# Patient Record
Sex: Male | Born: 1955
Health system: Southern US, Community
[De-identification: ages and names within clinical notes are randomized; demographics above are authoritative.]

## PROBLEM LIST (undated history)

## (undated) DIAGNOSIS — C801 Malignant (primary) neoplasm, unspecified: Secondary | ICD-10-CM

## (undated) DIAGNOSIS — J45909 Unspecified asthma, uncomplicated: Secondary | ICD-10-CM

## (undated) DIAGNOSIS — T7840XA Allergy, unspecified, initial encounter: Secondary | ICD-10-CM

## (undated) HISTORY — DX: Unspecified asthma, uncomplicated: J45.909

## (undated) HISTORY — DX: Malignant (primary) neoplasm, unspecified: C80.1

## (undated) HISTORY — DX: Allergy, unspecified, initial encounter: T78.40XA

## (undated) HISTORY — PX: HERNIA REPAIR: SHX51

---

## 1998-12-11 ENCOUNTER — Ambulatory Visit (HOSPITAL_COMMUNITY): Admission: RE | Admit: 1998-12-11 | Discharge: 1998-12-11 | Payer: Self-pay | Admitting: Surgery

## 2006-05-12 ENCOUNTER — Ambulatory Visit: Payer: Self-pay | Admitting: Internal Medicine

## 2006-05-26 ENCOUNTER — Ambulatory Visit: Payer: Self-pay | Admitting: Internal Medicine

## 2013-06-22 ENCOUNTER — Ambulatory Visit (INDEPENDENT_AMBULATORY_CARE_PROVIDER_SITE_OTHER): Payer: PRIVATE HEALTH INSURANCE | Admitting: Emergency Medicine

## 2013-06-22 VITALS — BP 118/76 | HR 75 | Temp 98.0°F | Resp 16 | Ht 71.0 in | Wt 159.0 lb

## 2013-06-22 DIAGNOSIS — Z760 Encounter for issue of repeat prescription: Secondary | ICD-10-CM

## 2013-06-22 DIAGNOSIS — K4090 Unilateral inguinal hernia, without obstruction or gangrene, not specified as recurrent: Secondary | ICD-10-CM

## 2013-06-22 MED ORDER — ALBUTEROL SULFATE HFA 108 (90 BASE) MCG/ACT IN AERS: 2.0000 | INHALATION_SPRAY | RESPIRATORY_TRACT | Status: DC | PRN

## 2013-06-22 MED ORDER — BUDESONIDE 180 MCG/ACT IN AEPB: 1.0000 | INHALATION_SPRAY | Freq: Two times a day (BID) | RESPIRATORY_TRACT | Status: DC

## 2013-06-22 NOTE — Progress Notes (Addendum)
   Subjective:    Patient ID: Shaun Lee, male    DOB: 04/13/1955, 58 y.o.   MRN: [id number]  HPI 58 yo male with complaint of needing his asthma medications refilled.  He is running out of albuterol and pulmicort.  Patient also complaints of hernia in right lower abdomen.  He denies pain, but has noticed buldge for several months.  No n/v/d/c.    PPMH:  Left inguinal hernia repair.    SH:  Nonsmoker, occasional alcohol   Review of Systems  Constitutional: Negative for fever and chills.  Gastrointestinal: Negative for nausea, vomiting, diarrhea and constipation.  Genitourinary: Negative for dysuria, urgency, penile swelling, penile pain and testicular pain.       Objective:   Physical Exam Blood pressure 118/76, pulse 75, temperature 98 F (36.7 C), temperature source Oral, resp. rate 16, height 5\' 11"  (1.803 m), weight 159 lb (72.122 kg), SpO2 99.00%. Body mass index is 22.19 kg/(m^2). Well-developed, well nourished male who is awake, alert and oriented, in NAD. HEENT: [place]/AT, PERRL, EOMI.  Sclera and conjunctiva are clear.  OP is clear. Neck: supple, non-tender, no lymphadenopathy, thyromegaly. Heart: RRR, no murmur Lungs: normal effort, CTA Abdomen: nontender right inguinal hernia, easily reducible. Extremities: no cyanosis, clubbing or edema. Skin: warm and dry without rash. Psychologic: good mood and appropriate affect, normal speech and behavior.      Assessment & Plan:  1.  Medication refill 2.  Hernia - easily reducible  Plan:  Patients medications refilled.  He had left inguinal hernia by Dr. Lucia Gaskins in 2000.  Does not wish to persue correction at this point; however, will consider it in winter.  If he elects to/needs referal to Dr. Lucia Gaskins he will call and this can be arranged.  I have reviewed and agree with documentation. Robert P. Laney Pastor, M.D.

## 2013-07-28 DIAGNOSIS — K4090 Unilateral inguinal hernia, without obstruction or gangrene, not specified as recurrent: Secondary | ICD-10-CM | POA: Insufficient documentation

## 2013-07-28 DIAGNOSIS — N4341 Spermatocele of epididymis, single: Secondary | ICD-10-CM | POA: Insufficient documentation

## 2015-09-07 ENCOUNTER — Encounter: Payer: Self-pay | Admitting: Family Medicine

## 2015-09-07 ENCOUNTER — Ambulatory Visit (INDEPENDENT_AMBULATORY_CARE_PROVIDER_SITE_OTHER): Payer: PRIVATE HEALTH INSURANCE | Admitting: Family Medicine

## 2015-09-07 VITALS — BP 116/70 | HR 78 | Temp 97.7°F | Resp 18 | Ht 71.0 in | Wt 157.7 lb

## 2015-09-07 DIAGNOSIS — Z131 Encounter for screening for diabetes mellitus: Secondary | ICD-10-CM

## 2015-09-07 DIAGNOSIS — Z0000 Encounter for general adult medical examination without abnormal findings: Secondary | ICD-10-CM | POA: Diagnosis not present

## 2015-09-07 DIAGNOSIS — M79672 Pain in left foot: Secondary | ICD-10-CM

## 2015-09-07 DIAGNOSIS — J4520 Mild intermittent asthma, uncomplicated: Secondary | ICD-10-CM | POA: Diagnosis not present

## 2015-09-07 DIAGNOSIS — Z23 Encounter for immunization: Secondary | ICD-10-CM

## 2015-09-07 DIAGNOSIS — J309 Allergic rhinitis, unspecified: Secondary | ICD-10-CM

## 2015-09-07 DIAGNOSIS — Z114 Encounter for screening for human immunodeficiency virus [HIV]: Secondary | ICD-10-CM | POA: Diagnosis not present

## 2015-09-07 DIAGNOSIS — Z85828 Personal history of other malignant neoplasm of skin: Secondary | ICD-10-CM | POA: Diagnosis not present

## 2015-09-07 DIAGNOSIS — Z13220 Encounter for screening for lipoid disorders: Secondary | ICD-10-CM | POA: Diagnosis not present

## 2015-09-07 DIAGNOSIS — M79671 Pain in right foot: Secondary | ICD-10-CM

## 2015-09-07 DIAGNOSIS — Z1159 Encounter for screening for other viral diseases: Secondary | ICD-10-CM

## 2015-09-07 DIAGNOSIS — Z125 Encounter for screening for malignant neoplasm of prostate: Secondary | ICD-10-CM

## 2015-09-07 LAB — PSA: PSA: 6.7 ng/mL — ABNORMAL HIGH (ref <=4.0)

## 2015-09-07 LAB — LIPID PANEL
Cholesterol: 190 mg/dL (ref 125–200)
HDL: 48 mg/dL (ref >=40)
LDL Cholesterol: 128 mg/dL (ref <130)
Total CHOL/HDL Ratio: 4 Ratio (ref <=5.0)
Triglycerides: 72 mg/dL (ref <150)
VLDL: 14 mg/dL (ref <30)

## 2015-09-07 LAB — COMPLETE METABOLIC PANEL WITH GFR
ALT: 23 U/L (ref 9–46)
AST: 24 U/L (ref 10–35)
Albumin: 4.9 g/dL (ref 3.6–5.1)
Alkaline Phosphatase: 48 U/L (ref 40–115)
BUN: 15 mg/dL (ref 7–25)
CO2: 28 mmol/L (ref 20–31)
Calcium: 9.7 mg/dL (ref 8.6–10.3)
Chloride: 101 mmol/L (ref 98–110)
Creat: 1.03 mg/dL (ref 0.70–1.25)
GFR, Est African American: >89 mL/min (ref >=60)
GFR, Est Non African American: 78 mL/min (ref >=60)
Glucose, Bld: 94 mg/dL (ref 65–99)
Potassium: 4.3 mmol/L (ref 3.5–5.3)
Sodium: 139 mmol/L (ref 135–146)
Total Bilirubin: 1.1 mg/dL (ref 0.2–1.2)
Total Protein: 7.4 g/dL (ref 6.1–8.1)

## 2015-09-07 LAB — HIV ANTIBODY (ROUTINE TESTING W REFLEX): HIV 1&2 Ab, 4th Generation: NONREACTIVE

## 2015-09-07 LAB — HEPATITIS C ANTIBODY: HCV Ab: NEGATIVE

## 2015-09-07 MED ORDER — ALBUTEROL SULFATE HFA 108 (90 BASE) MCG/ACT IN AERS
2.0000 | INHALATION_SPRAY | RESPIRATORY_TRACT | 1 refills | Status: DC | PRN
Start: 2015-09-07 — End: 2018-05-13

## 2015-09-07 MED ORDER — FLUTICASONE PROPIONATE 50 MCG/ACT NA SUSP: 1.0000 | Freq: Every day | NASAL | 1 refills | Status: DC

## 2015-09-07 MED ORDER — ZOSTER VACCINE LIVE 19400 UNT/0.65ML ~~LOC~~ SUSR: 0.6500 mL | Freq: Once | SUBCUTANEOUS | 0 refills | Status: AC

## 2015-09-07 MED ORDER — BUDESONIDE 180 MCG/ACT IN AEPB: 1.0000 | INHALATION_SPRAY | Freq: Two times a day (BID) | RESPIRATORY_TRACT | 5 refills | Status: DC

## 2015-09-07 NOTE — Patient Instructions (Addendum)
Continue same treatment for allergies and asthma, but if more than few times per week need of albuterol, or nighttime symptoms - return to discuss regimen further.  I would recommend following up with dermatologist for skin check with history of skin cancer. I will refer you.   Try silicone insert under heel or other insert to see if this helps foot pain. If not improving in the next 3-4 weeks, return for recheck.   Keeping you healthy  Get these tests  Blood pressure- Have your blood pressure checked once a year by your healthcare provider.  Normal blood pressure is 120/80  Weight- Have your body mass index (BMI) calculated to screen for obesity.  BMI is a measure of body fat based on height and weight. You can also calculate your own BMI at [email or web address].  Cholesterol- Have your cholesterol checked every year.  Diabetes- Have your blood sugar checked regularly if you have high blood pressure, high cholesterol, have a family history of diabetes or if you are overweight.  Screening for Colon Cancer- Colonoscopy starting at age 26.  Screening may begin sooner depending on your family history and other health conditions. Follow up colonoscopy as directed by your Gastroenterologist.  Screening for Prostate Cancer- Both blood work (PSA) and a rectal exam help screen for Prostate Cancer.  Screening begins at age 20 with African-American men and at age 34 with Caucasian men.  Screening may begin sooner depending on your family history.  Take these medicines  Aspirin- One aspirin daily can help prevent Heart disease and Stroke.  Flu shot- Every fall.  Tetanus- Every 10 years.  Zostavax- Once after the age of 24 to prevent Shingles.  Pneumonia shot- Once after the age of 24; if you are younger than 48, ask your healthcare provider if you need a Pneumonia shot.  Take these steps  Don't smoke- If you do smoke, talk to your doctor about quitting.  For tips on how to quit, go to  www.smokefree.gov or call 1-800-QUIT-NOW.  Be physically active- Exercise 5 days a week for at least 30 minutes.  If you are not already physically active start slow and gradually work up to 30 minutes of moderate physical activity.  Examples of moderate activity include walking briskly, mowing the yard, dancing, swimming, bicycling, etc.  Eat a healthy diet- Eat a variety of healthy food such as fruits, vegetables, low fat milk, low fat cheese, yogurt, lean meant, poultry, fish, beans, tofu, etc. For more information go to www.thenutritionsource.org  Drink alcohol in moderation- Limit alcohol intake to less than two drinks a day. Never drink and drive.  Dentist- Brush and floss twice daily; visit your dentist twice a year.  Depression- Your emotional health is as important as your physical health. If you're feeling down, or losing interest in things you would normally enjoy please talk to your healthcare provider.  Eye exam- Visit your eye doctor every year.  Safe sex- If you may be exposed to a sexually transmitted infection, use a condom.  Seat belts- Seat belts can save your life; always wear one.  Smoke/Carbon Monoxide detectors- These detectors need to be installed on the appropriate level of your home.  Replace batteries at least once a year.  Skin cancer- When out in the sun, cover up and use sunscreen 15 SPF or higher.  Violence- If anyone is threatening you, please tell your healthcare provider.  Living Will/ Health care power of attorney- Speak with your healthcare provider and family.  Asthma,  Adult Asthma is a recurring condition in which the airways tighten and narrow. Asthma can make it difficult to breathe. It can cause coughing, wheezing, and shortness of breath. Asthma episodes, also called asthma attacks, range from minor to life-threatening. Asthma cannot be cured, but medicines and lifestyle changes can help control it. CAUSES Asthma is believed to be caused by  inherited (genetic) and environmental factors, but its exact cause is unknown. Asthma may be triggered by allergens, lung infections, or irritants in the air. Asthma triggers are different for each person. Common triggers include:   Animal dander.  Dust mites.  Cockroaches.  Pollen from trees or grass.  Mold.  Smoke.  Air pollutants such as dust, household cleaners, hair sprays, aerosol sprays, paint fumes, strong chemicals, or strong odors.  Cold air, weather changes, and winds (which increase molds and pollens in the air).  Strong emotional expressions such as crying or laughing hard.  Stress.  Certain medicines (such as aspirin) or types of drugs (such as beta-blockers).  Sulfites in foods and drinks. Foods and drinks that may contain sulfites include dried fruit, potato chips, and sparkling grape juice.  Infections or inflammatory conditions such as the flu, a cold, or an inflammation of the nasal membranes (rhinitis).  Gastroesophageal reflux disease (GERD).  Exercise or strenuous activity. SYMPTOMS Symptoms may occur immediately after asthma is triggered or many hours later. Symptoms include:  Wheezing.  Excessive nighttime or early morning coughing.  Frequent or severe coughing with a common cold.  Chest tightness.  Shortness of breath. DIAGNOSIS  The diagnosis of asthma is made by a review of your medical history and a physical exam. Tests may also be performed. These may include:  Lung function studies. These tests show how much air you breathe in and out.  Allergy tests.  Imaging tests such as X-rays. TREATMENT  Asthma cannot be cured, but it can usually be controlled. Treatment involves identifying and avoiding your asthma triggers. It also involves medicines. There are 2 classes of medicine used for asthma treatment:   Controller medicines. These prevent asthma symptoms from occurring. They are usually taken every day.  Reliever or rescue medicines.  These quickly relieve asthma symptoms. They are used as needed and provide short-term relief. Your health care provider will help you create an asthma action plan. An asthma action plan is a written plan for managing and treating your asthma attacks. It includes a list of your asthma triggers and how they may be avoided. It also includes information on when medicines should be taken and when their dosage should be changed. An action plan may also involve the use of a device called a peak flow meter. A peak flow meter measures how well the lungs are working. It helps you monitor your condition. HOME CARE INSTRUCTIONS   Take medicines only as directed by your health care provider. Speak with your health care provider if you have questions about how or when to take the medicines.  Use a peak flow meter as directed by your health care provider. Record and keep track of readings.  Understand and use the action plan to help minimize or stop an asthma attack without needing to seek medical care.  Control your home environment in the following ways to help prevent asthma attacks:  Do not smoke. Avoid being exposed to secondhand smoke.  Change your heating and air conditioning filter regularly.  Limit your use of fireplaces and wood stoves.  Get rid of pests (such as roaches  and mice) and their droppings.  Throw away plants if you see mold on them.  Clean your floors and dust regularly. Use unscented cleaning products.  Try to have someone else vacuum for you regularly. Stay out of rooms while they are being vacuumed and for a short while afterward. If you vacuum, use a dust mask from a hardware store, a double-layered or microfilter vacuum cleaner bag, or a vacuum cleaner with a HEPA filter.  Replace carpet with wood, tile, or vinyl flooring. Carpet can trap dander and dust.  Use allergy-proof pillows, mattress covers, and box spring covers.  Wash bed sheets and blankets every week in hot water  and dry them in a dryer.  Use blankets that are made of polyester or cotton.  Clean bathrooms and kitchens with bleach. If possible, have someone repaint the walls in these rooms with mold-resistant paint. Keep out of the rooms that are being cleaned and painted.  Wash hands frequently. SEEK MEDICAL CARE IF:   You have wheezing, shortness of breath, or a cough even if taking medicine to prevent attacks.  The colored mucus you cough up (sputum) is thicker than usual.  Your sputum changes from clear or white to yellow, green, gray, or bloody.  You have any problems that may be related to the medicines you are taking (such as a rash, itching, swelling, or trouble breathing).  You are using a reliever medicine more than 2-3 times per week.  Your peak flow is still at 50-79% of your personal best after following your action plan for 1 hour.  You have a fever. SEEK IMMEDIATE MEDICAL CARE IF:   You seem to be getting worse and are unresponsive to treatment during an asthma attack.  You are short of breath even at rest.  You get short of breath when doing very little physical activity.  You have difficulty eating, drinking, or talking due to asthma symptoms.  You develop chest pain.  You develop a fast heartbeat.  You have a bluish color to your lips or fingernails.  You are light-headed, dizzy, or faint.  Your peak flow is less than 50% of your personal best.   This information is not intended to replace advice given to you by your health care provider. Make sure you discuss any questions you have with your health care provider.   Document Released: 01/14/2005 Document Revised: 10/05/2014 Document Reviewed: 08/13/2012 Elsevier Interactive Patient Education 2016 [employer].   IF you received an x-ray today, you will receive an invoice from [facility]. Please contact [facility] at [phone] with questions or concerns regarding your invoice.   IF you  received labwork today, you will receive an invoice from [employer]. Please contact [facility] at [phone] with questions or concerns regarding your invoice.   Our billing staff will not be able to assist you with questions regarding bills from these companies.  You will be contacted with the lab results as soon as they are available. The fastest way to get your results is to activate your My Chart account. Instructions are located on the last page of this paperwork. If you have not heard from [place] regarding the results in 2 weeks, please contact this office.

## 2015-09-07 NOTE — Progress Notes (Signed)
By signing my name below, I, Shaun Lee, attest that this documentation has been prepared under the direction and in the presence of Shaun Ray, MD.  Electronically Signed: Verlee Lee, Medical Scribe. 09/07/15. 10:00 AM.  Subjective:    Patient ID: Shaun Lee, male    DOB: December 07, 1955, 60 y.o.   MRN: [id number]  HPI Chief Complaint  Patient presents with  . Annual Exam    CPE    HPI Comments: Shaun Lee is a 60 y.o. male with a PMHx of asthma, and allergic rhinitis who presents to the [facility] for his annual physical. Pt is also complaining of heel pain. Pt works 60 hour work day and has been working for his job for 40 years.  Pt has been fasting this morning.  Heel Pain: Pt has been experiecing outside heel pain for 6 months the first time he get up when he's been sitting for a while, or when he puts pressure on his feet at night that's not as painful during the day. Pt mentions he got new work boots earlier this year and suspects it's causing his feel pain. Pt denies getting an injection for plantar fasciitis. Pt hasn't been taking any medications for his symptoms. Pt denies rash, bumps, or sleep disturbance from the pain.  FHx: Dad had a micro valve problem at 106-70. Pt' denies CP, SOB, chest tightness.  Cancer Screening: Prostate Cancer Screening: Last prostate test was about 10 years ago. Pt denies FHx of prostate CA, but his dad had some benign prostate problems. No results found for: PSA  Colon Cancer Screening: Last done in 2008 and was nl. Plan on repeat next year. Skin Cancer Screening: Pt had squamous cell carcinoma on his right upper back 15-20 years ago, which is the last time he saw his dermatologist. Pt reports a small itchy area on his arm.  Asthma: Takes Albuterol every couple of months, and Pulmicort 180 mcg BID. Pt mentions his Pulmicort inhaler last for month, and he has to use Albuterol 2-3 times a week during the spring. Pt  denies experiencing side effects while on his asthma medication- no oral fungal infections. Pt is not a smoker.   Allergic Rhinitis: Pt uses 1 spray of Flonase in each nostril, and takes Allegra for his allergies in the spring.  Immunizations: Pt doesn't remember his last tetanus shot, but it's been over 10 years. Pt's last flu vaccine was done Nov 2016 through his job (he gets it every year through them). Pt hasn't had the shingles vaccine.  There is no immunization history on file for this patient.  Exercise: Pt has been walking for exercise 4-5 times a week for 30 mins for a year now.  Dentist: Pt goes every 6 months.  Vision: Pt has prescription glasses at work, but he normally doesn't wear glasses.  Visual Acuity Screening   Right eye Left eye Both eyes  Without correction: 20/20 20/20 20/15   With correction:      Depression: Depression screen [facility] 2/9 09/07/2015  Decreased Interest 0  Down, Depressed, Hopeless 0  PHQ - 2 Score 0   HIV/Hep C Screening: Pt has not had his Hep C or HIV screening, but would like to get it today.  Functional Status:  Fall Screening:  Patient Active Problem List   Diagnosis Date Noted  . Inguinal hernia, right 07/28/2013  . Spermatocele of epididymis, single 07/28/2013   Past Medical History:  Diagnosis Date  . Allergy   .  Asthma   . Cancer [facility])    skin cancer   Past Surgical History:  Procedure Laterality Date  . HERNIA REPAIR     No Known Allergies Prior to Admission medications   Medication Sig Start Date End Date Taking? Authorizing Provider  albuterol (PROVENTIL HFA;VENTOLIN HFA) 108 (90 BASE) MCG/ACT inhaler Inhale 2 puffs into the lungs every 4 (four) hours as needed for wheezing or shortness of breath (cough, shortness of breath or wheezing.). 06/22/13   Hennie Duos, MD  albuterol (PROVENTIL, VENTOLIN) (5 MG/ML) 0.5% NEBU Take by nebulization continuous.    Historical Provider, MD  budesonide (PULMICORT FLEXHALER) 180  MCG/ACT inhaler Inhale 1 puff into the lungs 2 (two) times daily. 06/22/13   Hennie Duos, MD  Budesonide (PULMICORT IN) Inhale into the lungs.    Historical Provider, MD  Fexofenadine HCl (ALLEGRA PO) Take by mouth.    Historical Provider, MD  fluticasone (FLONASE) 50 MCG/ACT nasal spray Place into both nostrils daily.    Historical Provider, MD   Social History   Social History  . Marital status: Married    Spouse name: N/A  . Number of children: N/A  . Years of education: N/A   Occupational History  . Not on file.   Social History Main Topics  . Smoking status: Never Smoker  . Smokeless tobacco: Never Used  . Alcohol use Yes  . Drug use: No  . Sexual activity: Not on file   Other Topics Concern  . Not on file   Social History Narrative  . No narrative on file   Review of Systems  13 point ROS positive for myalgias, otherwise negative.  Objective:  Physical Exam  Constitutional: He is oriented to person, place, and time. He appears well-developed and well-nourished.  HENT:  Head: Normocephalic and atraumatic.  Right Ear: External ear normal.  Left Ear: External ear normal.  Mouth/Throat: Oropharynx is clear and moist.  Eyes: Conjunctivae and EOM are normal. Pupils are equal, round, and reactive to light.  Neck: Normal range of motion. Neck supple. No thyromegaly present.  Cardiovascular: Normal rate, regular rhythm, normal heart sounds and intact distal pulses.   Pulmonary/Chest: Effort normal and breath sounds normal. No respiratory distress. He has no wheezes.  Abdominal: Soft. He exhibits no distension. There is no tenderness. Hernia confirmed negative in the right inguinal area and confirmed negative in the left inguinal area.  Genitourinary: Prostate normal.  Musculoskeletal: Normal range of motion. He exhibits no edema or tenderness.  Neg lateral squeeze of calcaneus Plantar fasciitis non tender, no rash  Lymphadenopathy:    He has no cervical adenopathy.    Neurological: He is alert and oriented to person, place, and time. He has normal reflexes.  Skin: Skin is warm and dry.  Psychiatric: He has a normal mood and affect. His behavior is normal.  Vitals reviewed.  BP 116/70   Pulse 78   Temp 97.7 F (36.5 C) (Oral)   Resp 18   Ht 5\' 11"  (1.803 m)   Wt 157 lb 11.2 oz (71.5 kg)   SpO2 99%   BMI 21.99 kg/m  Assessment & Plan:    TANK SAYANI is a 60 y.o. male Annual physical exam  --anticipatory guidance as below in AVS, screening labs above. Health maintenance items as above in HPI discussed/recommended as applicable.   History of squamous cell carcinoma of skin - Plan: Ambulatory referral to Dermatology  -derm eval for routine eval/skin check.   Asthma,  mild intermittent, uncomplicated - Plan: budesonide (PULMICORT FLEXHALER) 180 MCG/ACT inhaler, albuterol (PROVENTIL HFA;VENTOLIN HFA) 108 (90 Base) MCG/ACT inhaler  - Able. Continue same medications, consider adding long-acting bronchodilator if increasing symptoms during allergy season.  Allergic rhinitis, unspecified allergic rhinitis type - Plan: fluticasone (FLONASE) 50 MCG/ACT nasal spray  -Stable. Continue Flonase. Antihistamine as needed.  Screening for prostate cancer - Plan: PSA  -We discussed pros and cons of prostate cancer screening, and after this discussion, he chose to have screening done. PSA obtained, and no concerning findings on DRE.   Need for hepatitis C screening test - Plan: Hepatitis C antibody  Screening for HIV (human immunodeficiency virus) - Plan: HIV antibody  Screening for diabetes mellitus - Plan: COMPLETE METABOLIC PANEL WITH GFR  Screening for hyperlipidemia - Plan: Lipid panel  Need for Tdap vaccination - Plan: Tdap vaccine greater than or equal to 7yo IM  Need for shingles vaccine - Plan: Zoster Vaccine Live, PF, (ZOSTAVAX) 16109 UNT/0.65ML injection printed for him to check in to cost.  Heel pain, bilateral  -Trial of over-the-counter  heel insert/silicone gel. If not improving, return for recheck.   Meds ordered this encounter  Medications  . budesonide (PULMICORT FLEXHALER) 180 MCG/ACT inhaler    Sig: Inhale 1 puff into the lungs 2 (two) times daily.    Dispense:  1 Inhaler    Refill:  5  . albuterol (PROVENTIL HFA;VENTOLIN HFA) 108 (90 Base) MCG/ACT inhaler    Sig: Inhale 2 puffs into the lungs every 4 (four) hours as needed for wheezing or shortness of breath (cough, shortness of breath or wheezing.).    Dispense:  1 Inhaler    Refill:  1  . fluticasone (FLONASE) 50 MCG/ACT nasal spray    Sig: Place 1-2 sprays into both nostrils daily.    Dispense:  16 g    Refill:  1  . Zoster Vaccine Live, PF, (ZOSTAVAX) 60454 UNT/0.65ML injection    Sig: Inject 19,400 Units into the skin once.    Dispense:  1 each    Refill:  0   Patient Instructions   Continue same treatment for allergies and asthma, but if more than few times per week need of albuterol, or nighttime symptoms - return to discuss regimen further.  I would recommend following up with dermatologist for skin check with history of skin cancer. I will refer you.   Try silicone insert under heel or other insert to see if this helps foot pain. If not improving in the next 3-4 weeks, return for recheck.   Keeping you healthy  Get these tests  Blood pressure- Have your blood pressure checked once a year by your healthcare provider.  Normal blood pressure is 120/80  Weight- Have your body mass index (BMI) calculated to screen for obesity.  BMI is a measure of body fat based on height and weight. You can also calculate your own BMI at ViewBanking.si.  Cholesterol- Have your cholesterol checked every year.  Diabetes- Have your blood sugar checked regularly if you have high blood pressure, high cholesterol, have a family history of diabetes or if you are overweight.  Screening for Colon Cancer- Colonoscopy starting at age 88.  Screening may begin  sooner depending on your family history and other health conditions. Follow up colonoscopy as directed by your Gastroenterologist.  Screening for Prostate Cancer- Both blood work (PSA) and a rectal exam help screen for Prostate Cancer.  Screening begins at age 82 with African-American men and at age  93 with Caucasian men.  Screening may begin sooner depending on your family history.  Take these medicines  Aspirin- One aspirin daily can help prevent Heart disease and Stroke.  Flu shot- Every fall.  Tetanus- Every 10 years.  Zostavax- Once after the age of 66 to prevent Shingles.  Pneumonia shot- Once after the age of 88; if you are younger than 2, ask your healthcare provider if you need a Pneumonia shot.  Take these steps  Don't smoke- If you do smoke, talk to your doctor about quitting.  For tips on how to quit, go to www.smokefree.gov or call 1-800-QUIT-NOW.  Be physically active- Exercise 5 days a week for at least 30 minutes.  If you are not already physically active start slow and gradually work up to 30 minutes of moderate physical activity.  Examples of moderate activity include walking briskly, mowing the yard, dancing, swimming, bicycling, etc.  Eat a healthy diet- Eat a variety of healthy food such as fruits, vegetables, low fat milk, low fat cheese, yogurt, lean meant, poultry, fish, beans, tofu, etc. For more information go to www.thenutritionsource.org  Drink alcohol in moderation- Limit alcohol intake to less than two drinks a day. Never drink and drive.  Dentist- Brush and floss twice daily; visit your dentist twice a year.  Depression- Your emotional health is as important as your physical health. If you're feeling down, or losing interest in things you would normally enjoy please talk to your healthcare provider.  Eye exam- Visit your eye doctor every year.  Safe sex- If you may be exposed to a sexually transmitted infection, use a condom.  Seat belts- Seat belts  can save your life; always wear one.  Smoke/Carbon Monoxide detectors- These detectors need to be installed on the appropriate level of your home.  Replace batteries at least once a year.  Skin cancer- When out in the sun, cover up and use sunscreen 15 SPF or higher.  Violence- If anyone is threatening you, please tell your healthcare provider.  Living Will/ Health care power of attorney- Speak with your healthcare provider and family.  Asthma, Adult Asthma is a recurring condition in which the airways tighten and narrow. Asthma can make it difficult to breathe. It can cause coughing, wheezing, and shortness of breath. Asthma episodes, also called asthma attacks, range from minor to life-threatening. Asthma cannot be cured, but medicines and lifestyle changes can help control it. CAUSES Asthma is believed to be caused by inherited (genetic) and environmental factors, but its exact cause is unknown. Asthma may be triggered by allergens, lung infections, or irritants in the air. Asthma triggers are different for each person. Common triggers include:   Animal dander.  Dust mites.  Cockroaches.  Pollen from trees or grass.  Mold.  Smoke.  Air pollutants such as dust, household cleaners, hair sprays, aerosol sprays, paint fumes, strong chemicals, or strong odors.  Cold air, weather changes, and winds (which increase molds and pollens in the air).  Strong emotional expressions such as crying or laughing hard.  Stress.  Certain medicines (such as aspirin) or types of drugs (such as beta-blockers).  Sulfites in foods and drinks. Foods and drinks that may contain sulfites include dried fruit, potato chips, and sparkling grape juice.  Infections or inflammatory conditions such as the flu, a cold, or an inflammation of the nasal membranes (rhinitis).  Gastroesophageal reflux disease (GERD).  Exercise or strenuous activity. SYMPTOMS Symptoms may occur immediately after asthma is  triggered or many hours  later. Symptoms include:  Wheezing.  Excessive nighttime or early morning coughing.  Frequent or severe coughing with a common cold.  Chest tightness.  Shortness of breath. DIAGNOSIS  The diagnosis of asthma is made by a review of your medical history and a physical exam. Tests may also be performed. These may include:  Lung function studies. These tests show how much air you breathe in and out.  Allergy tests.  Imaging tests such as X-rays. TREATMENT  Asthma cannot be cured, but it can usually be controlled. Treatment involves identifying and avoiding your asthma triggers. It also involves medicines. There are 2 classes of medicine used for asthma treatment:   Controller medicines. These prevent asthma symptoms from occurring. They are usually taken every day.  Reliever or rescue medicines. These quickly relieve asthma symptoms. They are used as needed and provide short-term relief. Your health care provider will help you create an asthma action plan. An asthma action plan is a written plan for managing and treating your asthma attacks. It includes a list of your asthma triggers and how they may be avoided. It also includes information on when medicines should be taken and when their dosage should be changed. An action plan may also involve the use of a device called a peak flow meter. A peak flow meter measures how well the lungs are working. It helps you monitor your condition. HOME CARE INSTRUCTIONS   Take medicines only as directed by your health care provider. Speak with your health care provider if you have questions about how or when to take the medicines.  Use a peak flow meter as directed by your health care provider. Record and keep track of readings.  Understand and use the action plan to help minimize or stop an asthma attack without needing to seek medical care.  Control your home environment in the following ways to help prevent asthma  attacks:  Do not smoke. Avoid being exposed to secondhand smoke.  Change your heating and air conditioning filter regularly.  Limit your use of fireplaces and wood stoves.  Get rid of pests (such as roaches and mice) and their droppings.  Throw away plants if you see mold on them.  Clean your floors and dust regularly. Use unscented cleaning products.  Try to have someone else vacuum for you regularly. Stay out of rooms while they are being vacuumed and for a short while afterward. If you vacuum, use a dust mask from a hardware store, a double-layered or microfilter vacuum cleaner bag, or a vacuum cleaner with a HEPA filter.  Replace carpet with wood, tile, or vinyl flooring. Carpet can trap dander and dust.  Use allergy-proof pillows, mattress covers, and box spring covers.  Wash bed sheets and blankets every week in hot water and dry them in a dryer.  Use blankets that are made of polyester or cotton.  Clean bathrooms and kitchens with bleach. If possible, have someone repaint the walls in these rooms with mold-resistant paint. Keep out of the rooms that are being cleaned and painted.  Wash hands frequently. SEEK MEDICAL CARE IF:   You have wheezing, shortness of breath, or a cough even if taking medicine to prevent attacks.  The colored mucus you cough up (sputum) is thicker than usual.  Your sputum changes from clear or white to yellow, green, gray, or bloody.  You have any problems that may be related to the medicines you are taking (such as a rash, itching, swelling, or trouble breathing).  You are using a reliever medicine more than 2-3 times per week.  Your peak flow is still at 50-79% of your personal best after following your action plan for 1 hour.  You have a fever. SEEK IMMEDIATE MEDICAL CARE IF:   You seem to be getting worse and are unresponsive to treatment during an asthma attack.  You are short of breath even at rest.  You get short of breath when  doing very little physical activity.  You have difficulty eating, drinking, or talking due to asthma symptoms.  You develop chest pain.  You develop a fast heartbeat.  You have a bluish color to your lips or fingernails.  You are light-headed, dizzy, or faint.  Your peak flow is less than 50% of your personal best.   This information is not intended to replace advice given to you by your health care provider. Make sure you discuss any questions you have with your health care provider.   Document Released: 01/14/2005 Document Revised: 10/05/2014 Document Reviewed: 08/13/2012 Elsevier Interactive Patient Education 2016 [employer].   IF you received an x-Lee today, you will receive an invoice from [facility]. Please contact [facility] at [phone] with questions or concerns regarding your invoice.   IF you received labwork today, you will receive an invoice from [employer]. Please contact [facility] at [phone] with questions or concerns regarding your invoice.   Our billing staff will not be able to assist you with questions regarding bills from these companies.  You will be contacted with the lab results as soon as they are available. The fastest way to get your results is to activate your My Chart account. Instructions are located on the last page of this paperwork. If you have not heard from [place] regarding the results in 2 weeks, please contact this office.        I personally performed the services described in this documentation, which was scribed in my presence. The recorded information has been reviewed and considered, and addended by me as needed.   Signed,   Shaun Ray, MD [facility].  09/08/15 10:15 PM

## 2015-09-10 ENCOUNTER — Other Ambulatory Visit: Payer: Self-pay | Admitting: Family Medicine

## 2015-09-10 DIAGNOSIS — R972 Elevated prostate specific antigen [PSA]: Secondary | ICD-10-CM

## 2015-09-14 ENCOUNTER — Telehealth: Payer: Self-pay

## 2015-09-14 ENCOUNTER — Encounter: Payer: Self-pay | Admitting: Radiology

## 2015-09-14 NOTE — Telephone Encounter (Signed)
PT states the office we reffered him to does not accept his insurance. Pt would like to be referred to "Shaun Lee" in [place], this is a [facility] healthcare urology center based on what the pt stated. I printed lab results and mailed to pts address.

## 2015-09-14 NOTE — Telephone Encounter (Signed)
Patient request a copy of his lab result to be mailed. He had his physical done on 09/07/2015. [phone].

## 2016-06-18 ENCOUNTER — Encounter: Payer: Self-pay | Admitting: Internal Medicine

## 2016-07-10 NOTE — Telephone Encounter (Signed)
error 

## 2018-04-24 ENCOUNTER — Other Ambulatory Visit: Payer: Self-pay

## 2018-04-24 ENCOUNTER — Other Ambulatory Visit: Payer: Self-pay | Admitting: Family Medicine

## 2018-04-24 DIAGNOSIS — J4520 Mild intermittent asthma, uncomplicated: Secondary | ICD-10-CM

## 2018-05-13 ENCOUNTER — Telehealth (INDEPENDENT_AMBULATORY_CARE_PROVIDER_SITE_OTHER): Payer: BLUE CROSS/BLUE SHIELD | Admitting: Family Medicine

## 2018-05-13 ENCOUNTER — Other Ambulatory Visit: Payer: Self-pay

## 2018-05-13 ENCOUNTER — Encounter: Payer: Self-pay | Admitting: Family Medicine

## 2018-05-13 DIAGNOSIS — J45909 Unspecified asthma, uncomplicated: Secondary | ICD-10-CM | POA: Diagnosis not present

## 2018-05-13 DIAGNOSIS — J4530 Mild persistent asthma, uncomplicated: Secondary | ICD-10-CM

## 2018-05-13 DIAGNOSIS — J4520 Mild intermittent asthma, uncomplicated: Secondary | ICD-10-CM

## 2018-05-13 DIAGNOSIS — J309 Allergic rhinitis, unspecified: Secondary | ICD-10-CM | POA: Insufficient documentation

## 2018-05-13 MED ORDER — ALBUTEROL SULFATE HFA 108 (90 BASE) MCG/ACT IN AERS: 2.0000 | INHALATION_SPRAY | RESPIRATORY_TRACT | 1 refills | Status: DC | PRN

## 2018-05-13 MED ORDER — BUDESONIDE 180 MCG/ACT IN AEPB
2.0000 | INHALATION_SPRAY | Freq: Two times a day (BID) | RESPIRATORY_TRACT | 5 refills | Status: DC
Start: 2018-05-13 — End: 2019-03-30

## 2018-05-13 NOTE — Progress Notes (Signed)
Virtual Visit via Telephone Note  I connected with Shaun Lee on 05/13/18 at 9:09 AM by telephone and verified that I am speaking with the correct person using two identifiers.   I discussed the limitations, risks, security and privacy concerns of performing an evaluation and management service by telephone and the availability of in person appointments. I also discussed with the patient that there may be a patient responsible charge related to this service. The patient expressed understanding and agreed to proceed, consent obtained  Chief complaint: Asthma, allergies  History of Present Illness:  Asthma: On pulmicort. Sometimes has not needed during the winter. Needed during allergy season. Uses 1 puff twice per day. Uses albuterol up to daily during allergy season- up to 2 times per day, usually once per day.   Allergic rhinitis: otc allegra and flonase. Primarily on allegra - flonase seems to cause hoarseness.    Patient Active Problem List   Diagnosis Date Noted  . Asthma 05/13/2018  . Allergic rhinitis 05/13/2018  . Inguinal hernia, right 07/28/2013  . Spermatocele of epididymis, single 07/28/2013   Past Medical History:  Diagnosis Date  . Allergy   . Asthma   . Cancer [facility])    skin cancer   Past Surgical History:  Procedure Laterality Date  . HERNIA REPAIR     No Known Allergies Prior to Admission medications   Medication Sig Start Date End Date Taking? Authorizing Provider  albuterol (PROVENTIL HFA;VENTOLIN HFA) 108 (90 Base) MCG/ACT inhaler Inhale 2 puffs into the lungs every 4 (four) hours as needed for wheezing or shortness of breath (cough, shortness of breath or wheezing.). 09/07/15  Yes Wendie Agreste, MD  budesonide (PULMICORT FLEXHALER) 180 MCG/ACT inhaler Inhale 1 puff into the lungs 2 (two) times daily. 09/07/15  Yes Wendie Agreste, MD  Fexofenadine HCl (ALLEGRA PO) Take by mouth.   Yes [provider]  fluticasone (FLONASE) 50 MCG/ACT  nasal spray Place 1-2 sprays into both nostrils daily. 09/07/15  Yes Wendie Agreste, MD   Social History   Socioeconomic History  . Marital status: Married    Spouse name: Not on file  . Number of children: Not on file  . Years of education: Not on file  . Highest education level: Not on file  Occupational History  . Not on file  Social Needs  . Financial resource strain: Not on file  . Food insecurity:    Worry: Not on file    Inability: Not on file  . Transportation needs:    Medical: Not on file    Non-medical: Not on file  Tobacco Use  . Smoking status: Never Smoker  . Smokeless tobacco: Never Used  Substance and Sexual Activity  . Alcohol use: Yes  . Drug use: No  . Sexual activity: Not on file  Lifestyle  . Physical activity:    Days per week: Not on file    Minutes per session: Not on file  . Stress: Not on file  Relationships  . Social connections:    Talks on phone: Not on file    Gets together: Not on file    Attends religious service: Not on file    Active member of club or organization: Not on file    Attends meetings of clubs or organizations: Not on file    Relationship status: Not on file  . Intimate partner violence:    Fear of current or ex partner: Not on file  Emotionally abused: Not on file    Physically abused: Not on file    Forced sexual activity: Not on file  Other Topics Concern  . Not on file  Social History Narrative  . Not on file     Observations/Objective:   Assessment and Plan: Uncomplicated asthma, unspecified asthma severity, unspecified whether persistent  Allergic rhinitis, unspecified seasonality, unspecified trigger  Although seasonal use of asthma medication, has decreased control recently with daily need for albuterol.  Increase Pulmicort to 2 puffs twice per day, update in the next few weeks to decide if change to Symbicort needed for LABA addition.  Continue albuterol as needed for now.  Allergies overall  controlled with Allegra, but correct use of Flonase discussed to minimize throat irritation if needed.  Understanding expressed.  Follow Up Instructions: 2 weeks by mychart or telemed.    I discussed the assessment and treatment plan with the patient. The patient was provided an opportunity to ask questions and all were answered. The patient agreed with the plan and demonstrated an understanding of the instructions.   The patient was advised to call back or seek an in-person evaluation if the symptoms worsen or if the condition fails to improve as anticipated.  I provided 9 minutes of non-face-to-face time during this encounter.  Signed,   [name], MD [facility] [place].  05/13/18

## 2018-05-13 NOTE — Patient Instructions (Signed)
Try 2 puffs of pulmicort twice daily, okay to use albuterol if needed, but goal of not having to use that more than once or at the most twice per week.  Follow up with me in next 2 weeks to decide if further changes needed, including possibly changing Pulmicort to Symbicort.  Continue Allegra for allergies, Flonase if needed using the technique we discussed.  Let me know if there are questions.  Good talking to you today, take care.    Asthma, Adult  Asthma is a long-term (chronic) condition that causes recurrent episodes in which the airways become tight and narrow. The airways are the passages that lead from the nose and mouth down into the lungs. Asthma episodes, also called asthma attacks, can cause coughing, wheezing, shortness of breath, and chest pain. The airways can also fill with mucus. During an attack, it can be difficult to breathe. Asthma attacks can range from minor to life threatening. Asthma cannot be cured, but medicines and lifestyle changes can help control it and treat acute attacks. What are the causes? This condition is believed to be caused by inherited (genetic) and environmental factors, but its exact cause is not known. There are many things that can bring on an asthma attack or make asthma symptoms worse (triggers). Asthma triggers are different for each person. Common triggers include:  Mold.  Dust.  Cigarette smoke.  Cockroaches.  Things that can cause allergy symptoms (allergens), such as animal dander or pollen from trees or grass.  Air pollutants such as household cleaners, wood smoke, smog, or Advertising account planner.  Cold air, weather changes, and winds (which increase molds and pollen in the air).  Strong emotional expressions such as crying or laughing hard.  Stress.  Certain medicines (such as aspirin) or types of medicines (such as beta-blockers).  Sulfites in foods and drinks. Foods and drinks that may contain sulfites include dried fruit, potato  chips, and sparkling grape juice.  Infections or inflammatory conditions such as the flu, a cold, or inflammation of the nasal membranes (rhinitis).  Gastroesophageal reflux disease (GERD).  Exercise or strenuous activity. What are the signs or symptoms? Symptoms of this condition may occur right after asthma is triggered or many hours later. Symptoms include:  Wheezing. This can sound like whistling when you breathe.  Excessive nighttime or early morning coughing.  Frequent or severe coughing with a common cold.  Chest tightness.  Shortness of breath.  Tiredness (fatigue) with minimal activity. How is this diagnosed? This condition is diagnosed based on:  Your medical history.  A physical exam.  Tests, which may include: ? Lung function studies and pulmonary studies (spirometry). These tests can evaluate the flow of air in your lungs. ? Allergy tests. ? Imaging tests, such as X-rays. How is this treated? There is no cure for this condition, but treatment can help control your symptoms. Treatment for asthma usually involves:  Identifying and avoiding your asthma triggers.  Using medicines to control your symptoms. Generally, two types of medicines are used to treat asthma: ? Controller medicines. These help prevent asthma symptoms from occurring. They are usually taken every day. ? Fast-acting reliever or rescue medicines. These quickly relieve asthma symptoms by widening the narrow and tight airways. They are used as needed and provide short-term relief.  Using supplemental oxygen. This may be needed during a severe episode.  Using other medicines, such as: ? Allergy medicines, such as antihistamines, if your asthma attacks are triggered by allergens. ? Immune medicines (immunomodulators).  These are medicines that help control the immune system.  Creating an asthma action plan. An asthma action plan is a written plan for managing and treating your asthma attacks. This  plan includes: ? A list of your asthma triggers and how to avoid them. ? Information about when medicines should be taken and when their dosage should be changed. ? Instructions about using a device called a peak flow meter. A peak flow meter measures how well the lungs are working and the severity of your asthma. It helps you monitor your condition. Follow these instructions at home: Controlling your home environment Control your home environment in the following ways to help avoid triggers and prevent asthma attacks:  Change your heating and air conditioning filter regularly.  Limit your use of fireplaces and wood stoves.  Get rid of pests (such as roaches and mice) and their droppings.  Throw away plants if you see mold on them.  Clean floors and dust surfaces regularly. Use unscented cleaning products.  Try to have someone else vacuum for you regularly. Stay out of rooms while they are being vacuumed and for a short while afterward. If you vacuum, use a dust mask from a hardware store, a double-layered or microfilter vacuum cleaner bag, or a vacuum cleaner with a HEPA filter.  Replace carpet with wood, tile, or vinyl flooring. Carpet can trap dander and dust.  Use allergy-proof pillows, mattress covers, and box spring covers.  Keep your bedroom a trigger-free room.  Avoid pets and keep windows closed when allergens are in the air.  Wash beddings every week in hot water and dry them in a dryer.  Use blankets that are made of polyester or cotton.  Clean bathrooms and kitchens with bleach. If possible, have someone repaint the walls in these rooms with mold-resistant paint. Stay out of the rooms that are being cleaned and painted.  Wash your hands often with soap and water. If soap and water are not available, use hand sanitizer.  Do not allow anyone to smoke in your home. General instructions  Take over-the-counter and prescription medicines only as told by your health care  provider. ? Speak with your health care provider if you have questions about how or when to take the medicines. ? Make note if you are requiring more frequent dosages.  Do not use any products that contain nicotine or tobacco, such as cigarettes and e-cigarettes. If you need help quitting, ask your health care provider. Also, avoid being exposed to secondhand smoke.  Use a peak flow meter as told by your health care provider. Record and keep track of the readings.  Understand and use the asthma action plan to help minimize, or stop an asthma attack, without needing to seek medical care.  Make sure you stay up to date on your yearly vaccinations as told by your health care provider. This may include vaccines for the flu and pneumonia.  Avoid outdoor activities when allergen counts are high and when air quality is low.  Wear a ski mask that covers your nose and mouth during outdoor winter activities. Exercise indoors on cold days if you can.  Warm up before exercising, and take time for a cool-down period after exercise.  Keep all follow-up visits as told by your health care provider. This is important. Where to find more information  For information about asthma, turn to the Centers for Disease Control and Prevention at http://www.clark.net/.htm  For air quality information, turn to AirNow at WeightRating.nl Contact  a health care provider if:  You have wheezing, shortness of breath, or a cough even while you are taking medicine to prevent attacks.  The mucus you cough up (sputum) is thicker than usual.  Your sputum changes from clear or white to yellow, green, gray, or bloody.  Your medicines are causing side effects, such as a rash, itching, swelling, or trouble breathing.  You need to use a reliever medicine more than 2-3 times a week.  Your peak flow reading is still at 50-79% of your personal best after following your action plan for 1 hour.  You have a fever. Get help  right away if:  You are getting worse and do not respond to treatment during an asthma attack.  You are short of breath when at rest or when doing very little physical activity.  You have difficulty eating, drinking, or talking.  You have chest pain or tightness.  You develop a fast heartbeat or palpitations.  You have a bluish color to your lips or fingernails.  You are light-headed or dizzy, or you faint.  Your peak flow reading is less than 50% of your personal best.  You feel too tired to breathe normally. Summary  Asthma is a long-term (chronic) condition that causes recurrent episodes in which the airways become tight and narrow. These episodes can cause coughing, wheezing, shortness of breath, and chest pain.  Asthma cannot be cured, but medicines and lifestyle changes can help control it and treat acute attacks.  Make sure you understand how to avoid triggers and how and when to use your medicines.  Asthma attacks can range from minor to life threatening. Get help right away if you have an asthma attack and do not respond to treatment with your usual rescue medicines. This information is not intended to replace advice given to you by your health care provider. Make sure you discuss any questions you have with your health care provider. Document Released: 01/14/2005 Document Revised: 02/19/2016 Document Reviewed: 02/19/2016 Elsevier Interactive Patient Education  2019 [employer].

## 2018-05-13 NOTE — Progress Notes (Signed)
Pt c/o  Medication refill of albuterol and plumicort.

## 2018-07-10 ENCOUNTER — Other Ambulatory Visit: Payer: Self-pay

## 2018-07-10 ENCOUNTER — Encounter: Payer: Self-pay | Admitting: Registered Nurse

## 2018-07-10 ENCOUNTER — Ambulatory Visit (INDEPENDENT_AMBULATORY_CARE_PROVIDER_SITE_OTHER): Payer: BLUE CROSS/BLUE SHIELD | Admitting: Registered Nurse

## 2018-07-10 VITALS — BP 138/72 | HR 75 | Temp 98.4°F | Resp 18 | Ht 71.65 in | Wt 160.0 lb

## 2018-07-10 DIAGNOSIS — Z0001 Encounter for general adult medical examination with abnormal findings: Secondary | ICD-10-CM | POA: Diagnosis not present

## 2018-07-10 DIAGNOSIS — R972 Elevated prostate specific antigen [PSA]: Secondary | ICD-10-CM | POA: Diagnosis not present

## 2018-07-10 DIAGNOSIS — Z1211 Encounter for screening for malignant neoplasm of colon: Secondary | ICD-10-CM

## 2018-07-10 DIAGNOSIS — Z13220 Encounter for screening for lipoid disorders: Secondary | ICD-10-CM

## 2018-07-10 DIAGNOSIS — L719 Rosacea, unspecified: Secondary | ICD-10-CM

## 2018-07-10 DIAGNOSIS — Z1329 Encounter for screening for other suspected endocrine disorder: Secondary | ICD-10-CM

## 2018-07-10 DIAGNOSIS — R0989 Other specified symptoms and signs involving the circulatory and respiratory systems: Secondary | ICD-10-CM

## 2018-07-10 DIAGNOSIS — R198 Other specified symptoms and signs involving the digestive system and abdomen: Secondary | ICD-10-CM

## 2018-07-10 DIAGNOSIS — R3915 Urgency of urination: Secondary | ICD-10-CM

## 2018-07-10 DIAGNOSIS — Z130 Encounter for screening for diseases of the blood and blood-forming organs and certain disorders involving the immune mechanism: Secondary | ICD-10-CM

## 2018-07-10 DIAGNOSIS — Z0000 Encounter for general adult medical examination without abnormal findings: Secondary | ICD-10-CM

## 2018-07-10 DIAGNOSIS — Z13228 Encounter for screening for other metabolic disorders: Secondary | ICD-10-CM

## 2018-07-10 LAB — POCT URINALYSIS DIP (MANUAL ENTRY)
Bilirubin, UA: NEGATIVE
Blood, UA: NEGATIVE
Glucose, UA: NEGATIVE mg/dL
Ketones, POC UA: NEGATIVE mg/dL
Leukocytes, UA: NEGATIVE
Nitrite, UA: NEGATIVE
Protein Ur, POC: NEGATIVE mg/dL
Spec Grav, UA: 1.02 (ref 1.010–1.025)
Urobilinogen, UA: 0.2 E.U./dL
pH, UA: 7 (ref 5.0–8.0)

## 2018-07-10 LAB — LIPID PANEL

## 2018-07-10 MED ORDER — BRIMONIDINE TARTRATE 0.33 % EX GEL: 1.0000 [drp] | Freq: Every day | CUTANEOUS | 2 refills | Status: DC

## 2018-07-10 NOTE — Patient Instructions (Signed)
° ° ° °  If you have lab work done today you will be contacted with your lab results within the next 2 weeks.  If you have not heard from us then please contact us. The fastest way to get your results is to register for My Chart. ° ° °IF you received an x-ray today, you will receive an invoice from [facility]. Please contact [facility] at [phone] with questions or concerns regarding your invoice.  ° °IF you received labwork today, you will receive an invoice from [facility]. Please contact [facility] at [phone] with questions or concerns regarding your invoice.  ° °Our billing staff will not be able to assist you with questions regarding bills from these companies. ° °You will be contacted with the lab results as soon as they are available. The fastest way to get your results is to activate your My Chart account. Instructions are located on the last page of this paperwork. If you have not heard from us regarding the results in 2 weeks, please contact this office. °  ° ° ° °

## 2018-07-10 NOTE — Progress Notes (Signed)
Established Patient Office Visit  Subjective:  Patient ID: Shaun Lee, male    DOB: Oct 06, 1955  Age: 63 y.o. MRN: [id number]  CC:  Chief Complaint  Patient presents with  . Annual Exam    HPI [name] presents for CPE.  Pt on Dr. Vonna Kotyk panel, Dr. Carlota Raspberry was fully booked. Wishes to stay on Dr. Vonna Kotyk panel.   Pt has history of asthma, allergies, skin cancer.  Asthma and allergies: Well controlled with current regimen. No complaints.  History of SCC - no current lesions of concern, will check skin today.  Pt also reports itching in ears - has been ongoing for years. Wears ear plugs at work constantly. No discharge, no change in hearing/tinnitus, no pain.  Pt also reports white spot on L tonsil - has been there for "a while", nonpainful, no dysphagia, no bleeding, no halitosis. Was noticed by dentist, concern for tonsillolith.   Past Medical History:  Diagnosis Date  . Allergy   . Asthma   . Cancer [facility])    skin cancer    Past Surgical History:  Procedure Laterality Date  . HERNIA REPAIR      Family History  Problem Relation Age of Onset  . Diabetes Mother   . Hypertension Mother   . Kidney disease Father     Social History   Socioeconomic History  . Marital status: Married    Spouse name: Not on file  . Number of children: 1  . Years of education: Not on file  . Highest education level: Not on file  Occupational History  . Not on file  Social Needs  . Financial resource strain: Not hard at all  . Food insecurity    Worry: Never true    Inability: Never true  . Transportation needs    Medical: No    Non-medical: No  Tobacco Use  . Smoking status: Never Smoker  . Smokeless tobacco: Never Used  Substance and Sexual Activity  . Alcohol use: Yes    Alcohol/week: 6.0 standard drinks    Types: 6 Cans of beer per week  . Drug use: No  . Sexual activity: Not Currently  Lifestyle  . Physical activity    Days per week: 3 days    Minutes  per session: 30 min  . Stress: Not at all  Relationships  . Social Herbalist on phone: Three times a week    Gets together: Twice a week    Attends religious service: More than 4 times per year    Active member of club or organization: No    Attends meetings of clubs or organizations: Never    Relationship status: Married  . Intimate partner violence    Fear of current or ex partner: No    Emotionally abused: No    Physically abused: No    Forced sexual activity: No  Other Topics Concern  . Not on file  Social History Narrative  . Not on file    Outpatient Medications Prior to Visit  Medication Sig Dispense Refill  . albuterol (PROVENTIL HFA;VENTOLIN HFA) 108 (90 Base) MCG/ACT inhaler Inhale 2 puffs into the lungs every 4 (four) hours as needed for wheezing or shortness of breath (cough, shortness of breath or wheezing.). 1 Inhaler 1  . budesonide (PULMICORT FLEXHALER) 180 MCG/ACT inhaler Inhale 2 puffs into the lungs 2 (two) times daily. 1 Inhaler 5  . Fexofenadine HCl (ALLEGRA PO) Take by mouth.    Marland Kitchen  fluticasone (FLONASE) 50 MCG/ACT nasal spray Place 1-2 sprays into both nostrils daily. 16 g 1   No facility-administered medications prior to visit.     No Known Allergies  ROS Review of Systems  Constitutional: Negative.   HENT: Negative.  Negative for congestion, dental problem, ear discharge, ear pain, hearing loss, mouth sores, postnasal drip, rhinorrhea, sinus pain, sore throat and trouble swallowing.   Eyes: Negative.   Respiratory: Negative.  Negative for cough and shortness of breath.   Cardiovascular: Negative.   Gastrointestinal: Negative.   Endocrine: Negative.   Genitourinary: Negative.   Musculoskeletal: Negative.   Skin: Negative.   Allergic/Immunologic: Negative.   Neurological: Negative.   Hematological: Negative.   Psychiatric/Behavioral: Negative.   All other systems reviewed and are negative.     Objective:    Physical Exam   Constitutional: He is oriented to person, place, and time. He appears well-developed and well-nourished.  HENT:  Head: Normocephalic and atraumatic.  Right Ear: External ear normal.  Left Ear: External ear normal.  Nose: Nose normal.  Mouth/Throat: Oropharynx is clear and moist. No oropharyngeal exudate.  Eyes: Pupils are equal, round, and reactive to light. Conjunctivae and EOM are normal. Right eye exhibits no discharge. Left eye exhibits no discharge. No scleral icterus.  Neck: Normal range of motion. Neck supple. No tracheal deviation present. No thyromegaly present.  Cardiovascular: Normal rate, regular rhythm and intact distal pulses. Exam reveals friction rub. Exam reveals no gallop.  No murmur heard. Pulmonary/Chest: Effort normal and breath sounds normal. No respiratory distress. He has no wheezes. He has no rales. He exhibits no tenderness.  Abdominal: Soft. Bowel sounds are normal. He exhibits no distension and no mass. There is no abdominal tenderness. There is no rebound and no guarding.  Musculoskeletal: Normal range of motion.        General: No tenderness, deformity or edema.  Neurological: He is alert and oriented to person, place, and time. No cranial nerve deficit. Coordination normal.  Skin: Skin is warm. No rash noted. No erythema. No pallor.  Psychiatric: He has a normal mood and affect. His behavior is normal. Judgment and thought content normal.    BP 138/72   Pulse 75   Temp 98.4 F (36.9 C) (Oral)   Resp 18   Ht 5' 11.65" (1.82 m)   Wt 160 lb (72.6 kg)   SpO2 98%   BMI 21.91 kg/m  Wt Readings from Last 3 Encounters:  07/10/18 160 lb (72.6 kg)  09/07/15 157 lb 11.2 oz (71.5 kg)  06/22/13 159 lb (72.1 kg)     Health Maintenance Due  Topic Date Due  . COLONOSCOPY  05/25/2016    There are no preventive care reminders to display for this patient.  No results found for: TSH No results found for: WBC, HGB, HCT, MCV, PLT Lab Results  Component Value  Date   NA 139 09/07/2015   K 4.3 09/07/2015   CO2 28 09/07/2015   GLUCOSE 94 09/07/2015   BUN 15 09/07/2015   CREATININE 1.03 09/07/2015   BILITOT 1.1 09/07/2015   ALKPHOS 48 09/07/2015   AST 24 09/07/2015   ALT 23 09/07/2015   PROT 7.4 09/07/2015   ALBUMIN 4.9 09/07/2015   CALCIUM 9.7 09/07/2015   Lab Results  Component Value Date   CHOL 190 09/07/2015   Lab Results  Component Value Date   HDL 48 09/07/2015   Lab Results  Component Value Date   LDLCALC 128 09/07/2015  Lab Results  Component Value Date   TRIG 72 09/07/2015   Lab Results  Component Value Date   CHOLHDL 4.0 09/07/2015   No results found for: HGBA1C    Assessment & Plan:   Problem List Items Addressed This Visit    None    Visit Diagnoses    Routine general medical examination at a health care facility    -  Primary   Colon cancer screening       Relevant Orders   Cologuard   Urinary urgency       Relevant Orders   POCT urinalysis dipstick (Completed)   Elevated PSA measurement       Relevant Orders   PSA   Ambulatory referral to Urology   Lipid screening       Relevant Orders   Lipid panel   Screening for endocrine, metabolic and immunity disorder       Relevant Orders   CBC with Differential/Platelet   Comprehensive metabolic panel   Hemoglobin A1c   Tonsil symptom       Relevant Orders   Ambulatory referral to ENT   Rosacea       Relevant Medications   Brimonidine Tartrate 0.33 % GEL      Meds ordered this encounter  Medications  . Brimonidine Tartrate 0.33 % GEL    Sig: Apply 1 drop topically daily.    Dispense:  1 Tube    Refill:  2    Order Specific Question:   Supervising Provider    Answer:   Forrest Moron [id number]    Follow-up: No follow-ups on file.   PLAN:  Pt recalls history of rosacea, requests refill on brimonidine for PRN usage- refill given  Respiratory med refills given recently by PCP  Ear itching: Dry skin, likely due to wearing hearing  protection so often. No signs of infection on internal or external ear. Recommend moisturizer after work  Tonsils: whitish lump, suspicious for tonsil stone - perhaps a bit too firm, unable to dislodge in office today. Nontender, no discharge, tonsil minimally swollen +1, minimally erythematic. [facility] referral for assessment..  Labs today - will start cologuard as well for screening.   Patient encouraged to call clinic with any questions, comments, or concerns.  Maximiano Coss, NP

## 2018-07-11 LAB — CBC WITH DIFFERENTIAL/PLATELET
Basophils Absolute: 0 10*3/uL (ref 0.0–0.2)
Basos: 1 %
EOS (ABSOLUTE): 0 10*3/uL (ref 0.0–0.4)
Eos: 1 %
Hematocrit: 46 % (ref 37.5–51.0)
Hemoglobin: 15.8 g/dL (ref 13.0–17.7)
Immature Grans (Abs): 0 10*3/uL (ref 0.0–0.1)
Immature Granulocytes: 1 %
Lymphocytes Absolute: 0.7 10*3/uL (ref 0.7–3.1)
Lymphs: 17 %
MCH: 31.7 pg (ref 26.6–33.0)
MCHC: 34.3 g/dL (ref 31.5–35.7)
MCV: 92 fL (ref 79–97)
Monocytes Absolute: 0.3 10*3/uL (ref 0.1–0.9)
Monocytes: 8 %
Neutrophils Absolute: 2.8 10*3/uL (ref 1.4–7.0)
Neutrophils: 72 %
Platelets: 186 10*3/uL (ref 150–450)
RBC: 4.98 x10E6/uL (ref 4.14–5.80)
RDW: 12.8 % (ref 11.6–15.4)
WBC: 3.8 10*3/uL (ref 3.4–10.8)

## 2018-07-11 LAB — COMPREHENSIVE METABOLIC PANEL
ALT: 30 IU/L (ref 0–44)
AST: 26 IU/L (ref 0–40)
Albumin/Globulin Ratio: 2 (ref 1.2–2.2)
Albumin: 4.7 g/dL (ref 3.8–4.8)
Alkaline Phosphatase: 57 IU/L (ref 39–117)
BUN/Creatinine Ratio: 21 (ref 10–24)
BUN: 17 mg/dL (ref 8–27)
Bilirubin Total: 0.6 mg/dL (ref 0.0–1.2)
CO2: 25 mmol/L (ref 20–29)
Calcium: 9.4 mg/dL (ref 8.6–10.2)
Chloride: 102 mmol/L (ref 96–106)
Creatinine, Ser: 0.8 mg/dL (ref 0.76–1.27)
GFR calc Af Amer: 109 mL/min/{1.73_m2} (ref >59)
GFR calc non Af Amer: 94 mL/min/{1.73_m2} (ref >59)
Globulin, Total: 2.3 g/dL (ref 1.5–4.5)
Glucose: 97 mg/dL (ref 65–99)
Potassium: 4.6 mmol/L (ref 3.5–5.2)
Sodium: 140 mmol/L (ref 134–144)
Total Protein: 7 g/dL (ref 6.0–8.5)

## 2018-07-11 LAB — LIPID PANEL
Chol/HDL Ratio: 5.4 ratio — ABNORMAL HIGH (ref 0.0–5.0)
Cholesterol, Total: 227 mg/dL — ABNORMAL HIGH (ref 100–199)
HDL: 42 mg/dL (ref >39)
LDL Calculated: 160 mg/dL — ABNORMAL HIGH (ref 0–99)
Triglycerides: 127 mg/dL (ref 0–149)
VLDL Cholesterol Cal: 25 mg/dL (ref 5–40)

## 2018-07-11 LAB — PSA: Prostate Specific Ag, Serum: 6.4 ng/mL — ABNORMAL HIGH (ref 0.0–4.0)

## 2018-07-11 LAB — HEMOGLOBIN A1C
Est. average glucose Bld gHb Est-mCnc: 108 mg/dL
Hgb A1c MFr Bld: 5.4 % (ref 4.8–5.6)

## 2018-07-13 ENCOUNTER — Telehealth: Payer: Self-pay | Admitting: Family Medicine

## 2018-07-13 ENCOUNTER — Encounter: Payer: Self-pay | Admitting: Registered Nurse

## 2018-07-13 NOTE — Progress Notes (Signed)
Letter as requested by patient.  Kathrin Ruddy, NP

## 2018-07-13 NOTE — Telephone Encounter (Signed)
Copied from [place] [id number]. Topic: General - Other >> Jul 10, 2018  3:20 PM Leward Quan A wrote: Reason for CRM: Patient called to say that Rx that was sent in to the pharmacy is too expensive with the insurance he will still have to pay $200 and that is too much. Patient is asking for a new Rx to be sent in stated that he used Metrogel previously. Please advise Ph# [phone]

## 2018-07-14 ENCOUNTER — Other Ambulatory Visit: Payer: Self-pay | Admitting: Registered Nurse

## 2018-07-14 DIAGNOSIS — L719 Rosacea, unspecified: Secondary | ICD-10-CM

## 2018-07-14 MED ORDER — METRONIDAZOLE 0.75 % EX GEL: 1.0000 "application " | Freq: Two times a day (BID) | CUTANEOUS | 0 refills | Status: DC

## 2018-07-14 NOTE — Telephone Encounter (Signed)
Order sent to pharmacy - should be ready within an hour or so.  Thank you, Kathrin Ruddy

## 2018-07-14 NOTE — Progress Notes (Signed)
Pt request Metrogel, as brimonidine gel is too expensive  Sent to pharmacy  Kathrin Ruddy, NP

## 2018-07-14 NOTE — Telephone Encounter (Signed)
Please advise  This patient last seen you and was prescribed this Metrogel which is too expensive.

## 2019-03-30 ENCOUNTER — Other Ambulatory Visit: Payer: Self-pay | Admitting: Family Medicine

## 2019-03-30 DIAGNOSIS — J4530 Mild persistent asthma, uncomplicated: Secondary | ICD-10-CM

## 2019-03-30 DIAGNOSIS — J4520 Mild intermittent asthma, uncomplicated: Secondary | ICD-10-CM

## 2019-03-30 NOTE — Telephone Encounter (Signed)
Requested medication (s) are due for refill today: yes  Requested medication (s) are on the active medication list: yes  Last refill:  03/01/2019  Future visit scheduled:no  Notes to clinic:  one inhaler should at least one month Due for follow up   Requested Prescriptions  Pending Prescriptions Disp Refills   albuterol (VENTOLIN HFA) 108 (90 Base) MCG/ACT inhaler [Pharmacy Med Name: ALBUTEROL HFA (PROVENTIL) INH]  1    Sig: Inhale 2 puffs into the lungs every 4 (four) hours as needed for wheezing or shortness of breath (cough, shortness of breath or wheezing.).      Pulmonology:  Beta Agonists Failed - 03/30/2019  1:18 AM      Failed - One inhaler should last at least one month. If the patient is requesting refills earlier, contact the patient to check for uncontrolled symptoms.      Passed - Valid encounter within last 12 months    Recent Outpatient Visits           8 months ago Routine general medical examination at a health care facility   [facility], Delfino Lovett, NP   10 months ago Allergic rhinitis, unspecified seasonality, unspecified trigger   [facility], Ranell Patrick, MD   3 years ago Annual physical exam   [facility], MD   5 years ago Inguinal hernia   [facility], Merlinda Frederick, MD                [place] [address] MCG/ACT inhaler [Pharmacy Med Name: PULMICORT [address] [place] 1 each 5    Sig: TAKE 2 [place] A DAY      Pulmonology:  Corticosteroids Passed - 03/30/2019  1:18 AM      Passed - Valid encounter within last 12 months    Recent Outpatient Visits           8 months ago Routine general medical examination at a health care facility   [facility], Delfino Lovett, NP   10 months ago Allergic rhinitis, unspecified seasonality, unspecified trigger   [facility], Ranell Patrick, MD   3 years ago Annual physical exam   [facility], Ranell Patrick, MD   5 years ago Inguinal hernia   [facility], Merlinda Frederick, MD

## 2019-06-21 DIAGNOSIS — N403 Nodular prostate with lower urinary tract symptoms: Secondary | ICD-10-CM | POA: Diagnosis not present

## 2019-06-21 DIAGNOSIS — R3914 Feeling of incomplete bladder emptying: Secondary | ICD-10-CM | POA: Diagnosis not present

## 2019-06-21 DIAGNOSIS — R3912 Poor urinary stream: Secondary | ICD-10-CM | POA: Diagnosis not present

## 2019-06-21 DIAGNOSIS — R351 Nocturia: Secondary | ICD-10-CM | POA: Diagnosis not present

## 2019-08-23 DIAGNOSIS — M25561 Pain in right knee: Secondary | ICD-10-CM | POA: Diagnosis not present

## 2019-09-20 DIAGNOSIS — R351 Nocturia: Secondary | ICD-10-CM | POA: Diagnosis not present

## 2019-09-20 DIAGNOSIS — N403 Nodular prostate with lower urinary tract symptoms: Secondary | ICD-10-CM | POA: Diagnosis not present

## 2019-09-20 DIAGNOSIS — N5201 Erectile dysfunction due to arterial insufficiency: Secondary | ICD-10-CM | POA: Diagnosis not present

## 2019-09-20 DIAGNOSIS — R3912 Poor urinary stream: Secondary | ICD-10-CM | POA: Diagnosis not present

## 2019-12-01 DIAGNOSIS — R972 Elevated prostate specific antigen [PSA]: Secondary | ICD-10-CM | POA: Diagnosis not present

## 2019-12-08 DIAGNOSIS — R3912 Poor urinary stream: Secondary | ICD-10-CM | POA: Diagnosis not present

## 2019-12-08 DIAGNOSIS — R972 Elevated prostate specific antigen [PSA]: Secondary | ICD-10-CM | POA: Diagnosis not present

## 2019-12-08 DIAGNOSIS — N403 Nodular prostate with lower urinary tract symptoms: Secondary | ICD-10-CM | POA: Diagnosis not present

## 2019-12-08 DIAGNOSIS — R351 Nocturia: Secondary | ICD-10-CM | POA: Diagnosis not present

## 2020-01-05 DIAGNOSIS — Z0120 Encounter for dental examination and cleaning without abnormal findings: Secondary | ICD-10-CM | POA: Diagnosis not present

## 2020-05-31 DIAGNOSIS — R972 Elevated prostate specific antigen [PSA]: Secondary | ICD-10-CM | POA: Diagnosis not present

## 2020-06-05 ENCOUNTER — Other Ambulatory Visit: Payer: Self-pay

## 2020-06-05 ENCOUNTER — Encounter: Payer: Self-pay | Admitting: Family Medicine

## 2020-06-05 ENCOUNTER — Ambulatory Visit (INDEPENDENT_AMBULATORY_CARE_PROVIDER_SITE_OTHER): Payer: Medicare Other | Admitting: Family Medicine

## 2020-06-05 VITALS — BP 118/62 | HR 84 | Temp 98.1°F | Resp 16 | Ht 71.6 in | Wt 155.6 lb

## 2020-06-05 DIAGNOSIS — Z23 Encounter for immunization: Secondary | ICD-10-CM | POA: Diagnosis not present

## 2020-06-05 DIAGNOSIS — Z0000 Encounter for general adult medical examination without abnormal findings: Secondary | ICD-10-CM | POA: Diagnosis not present

## 2020-06-05 DIAGNOSIS — Z131 Encounter for screening for diabetes mellitus: Secondary | ICD-10-CM | POA: Diagnosis not present

## 2020-06-05 DIAGNOSIS — E785 Hyperlipidemia, unspecified: Secondary | ICD-10-CM

## 2020-06-05 DIAGNOSIS — H9313 Tinnitus, bilateral: Secondary | ICD-10-CM

## 2020-06-05 DIAGNOSIS — Z1211 Encounter for screening for malignant neoplasm of colon: Secondary | ICD-10-CM

## 2020-06-05 DIAGNOSIS — Z833 Family history of diabetes mellitus: Secondary | ICD-10-CM

## 2020-06-05 LAB — COMPREHENSIVE METABOLIC PANEL
ALT: 18 U/L (ref 0–53)
AST: 21 U/L (ref 0–37)
Albumin: 4.8 g/dL (ref 3.5–5.2)
Alkaline Phosphatase: 60 U/L (ref 39–117)
BUN: 16 mg/dL (ref 6–23)
CO2: 30 mEq/L (ref 19–32)
Calcium: 10 mg/dL (ref 8.4–10.5)
Chloride: 102 mEq/L (ref 96–112)
Creatinine, Ser: 0.92 mg/dL (ref 0.40–1.50)
GFR: 86.92 mL/min (ref >60.00)
Glucose, Bld: 89 mg/dL (ref 70–99)
Potassium: 4.5 mEq/L (ref 3.5–5.1)
Sodium: 141 mEq/L (ref 135–145)
Total Bilirubin: 1.3 mg/dL — ABNORMAL HIGH (ref 0.2–1.2)
Total Protein: 7.3 g/dL (ref 6.0–8.3)

## 2020-06-05 LAB — LIPID PANEL
Cholesterol: 171 mg/dL (ref 0–200)
HDL: 47 mg/dL (ref >39.00)
LDL Cholesterol: 110 mg/dL — ABNORMAL HIGH (ref 0–99)
NonHDL: 124.28
Total CHOL/HDL Ratio: 4
Triglycerides: 70 mg/dL (ref 0.0–149.0)
VLDL: 14 mg/dL (ref 0.0–40.0)

## 2020-06-05 LAB — HEMOGLOBIN A1C: Hgb A1c MFr Bld: 5.3 % (ref 4.6–6.5)

## 2020-06-05 NOTE — Patient Instructions (Addendum)
Pneumonia vaccine and 1st shingles vaccine today.  Next pneumonia vaccine in 1 year.  I will refer you to ENT.    Return to the clinic or go to the nearest emergency room if any of your symptoms worsen or new symptoms occur.  Preventive Care 66 Years and Older, Male Preventive care refers to lifestyle choices and visits with your health care provider that can promote health and wellness. This includes:  A yearly physical exam. This is also called an annual wellness visit.  Regular dental and eye exams.  Immunizations.  Screening for certain conditions.  Healthy lifestyle choices, such as: ? Eating a healthy diet. ? Getting regular exercise. ? Not using drugs or products that contain nicotine and tobacco. ? Limiting alcohol use. What can I expect for my preventive care visit? Physical exam Your health care provider will check your:  Height and weight. These may be used to calculate your BMI (body mass index). BMI is a measurement that tells if you are at a healthy weight.  Heart rate and blood pressure.  Body temperature.  Skin for abnormal spots. Counseling Your health care provider may ask you questions about your:  Past medical problems.  Family's medical history.  Alcohol, tobacco, and drug use.  Emotional well-being.  Home life and relationship well-being.  Sexual activity.  Diet, exercise, and sleep habits.  History of falls.  Memory and ability to understand (cognition).  Work and work Astronomer.  Access to firearms. What immunizations do I need? Vaccines are usually given at various ages, according to a schedule. Your health care provider will recommend vaccines for you based on your age, medical history, and lifestyle or other factors, such as travel or where you work.   What tests do I need? Blood tests  Lipid and cholesterol levels. These may be checked every 5 years, or more often depending on your overall health.  Hepatitis C  test.  Hepatitis B test. Screening  Lung cancer screening. You may have this screening every year starting at age 66 if you have a 30-pack-year history of smoking and currently smoke or have quit within the past 15 years.  Colorectal cancer screening. ? All adults should have this screening starting at age 66 and continuing until age 66. ? Your health care provider may recommend screening at age 66 if you are at increased risk. ? You will have tests every 1-10 years, depending on your results and the type of screening test.  Prostate cancer screening. Recommendations will vary depending on your family history and other risks.  Genital exam to check for testicular cancer or hernias.  Diabetes screening. ? This is done by checking your blood sugar (glucose) after you have not eaten for a while (fasting). ? You may have this done every 1-3 years.  Abdominal aortic aneurysm (AAA) screening. You may need this if you are a current or former smoker.  STD (sexually transmitted disease) testing, if you are at risk. Follow these instructions at home: Eating and drinking  Eat a diet that includes fresh fruits and vegetables, whole grains, lean protein, and low-fat dairy products. Limit your intake of foods with high amounts of sugar, saturated fats, and salt.  Take vitamin and mineral supplements as recommended by your health care provider.  Do not drink alcohol if your health care provider tells you not to drink.  If you drink alcohol: ? Limit how much you have to 0-2 drinks a day. ? Be aware of how much alcohol  is in your drink. In the [place] one drink equals one 12 oz bottle of beer (355 mL), one 5 oz glass of wine (148 mL), or one 1 oz glass of hard liquor (44 mL).   Lifestyle  Take daily care of your teeth and gums. Brush your teeth every morning and night with fluoride toothpaste. Floss one time each day.  Stay active. Exercise for at least 30 minutes 5 or more days each week.  Do  not use any products that contain nicotine or tobacco, such as cigarettes, e-cigarettes, and chewing tobacco. If you need help quitting, ask your health care provider.  Do not use drugs.  If you are sexually active, practice safe sex. Use a condom or other form of protection to prevent STIs (sexually transmitted infections).  Talk with your health care provider about taking a low-dose aspirin or statin.  Find healthy ways to cope with stress, such as: ? Meditation, yoga, or listening to music. ? Journaling. ? Talking to a trusted person. ? Spending time with friends and family. Safety  Always wear your seat belt while driving or riding in a vehicle.  Do not drive: ? If you have been drinking alcohol. Do not ride with someone who has been drinking. ? When you are tired or distracted. ? While texting.  Wear a helmet and other protective equipment during sports activities.  If you have firearms in your house, make sure you follow all gun safety procedures. What's next?  Visit your health care provider once a year for an annual wellness visit.  Ask your health care provider how often you should have your eyes and teeth checked.  Stay up to date on all vaccines. This information is not intended to replace advice given to you by your health care provider. Make sure you discuss any questions you have with your health care provider. Document Revised: 10/13/2018 Document Reviewed: 01/08/2018 Elsevier Patient Education  2021 [employer].  Tinnitus Tinnitus refers to hearing a sound when there is no actual source for that sound. This is often described as ringing in the ears. However, people with this condition may hear a variety of noises, in one ear or in both ears. The sounds of tinnitus can be soft, loud, or somewhere in between. Tinnitus can last for a few seconds or can be constant for days. It may go away without treatment and come back at various times. When tinnitus is constant  or happens often, it can lead to other problems, such as trouble sleeping and trouble concentrating. Almost everyone experiences tinnitus at some point. Tinnitus that is long-lasting (chronic) or comes back often (recurs) may require medical attention. What are the causes? The cause of tinnitus is often not known. In some cases, it can result from:  Exposure to loud noises from machinery, music, or other sources.  An object (foreign body) stuck in the ear.  Earwax buildup.  Drinking alcohol or caffeine.  Taking certain medicines.  Age-related hearing loss. It may also be caused by medical conditions such as:  Ear or sinus infections.  High blood pressure.  Heart diseases.  Anemia.  Allergies.  Meniere's disease.  Thyroid problems.  Tumors.  A weak, bulging blood vessel (aneurysm) near the ear. What are the signs or symptoms? The main symptom of tinnitus is hearing a sound when there is no source for that sound. It may sound like:  Buzzing.  Roaring.  Ringing.  Blowing air.  Hissing.  Whistling.  Sizzling.  Humming.  Running water.  A musical note.  Tapping. Symptoms may affect only one ear (unilateral) or both ears (bilateral). How is this diagnosed? Tinnitus is diagnosed based on your symptoms, your medical history, and a physical exam. Your health care provider may do a thorough hearing test (audiologic exam) if your tinnitus:  Is unilateral.  Causes hearing difficulties.  Lasts 6 months or longer. You may work with a health care provider who specializes in hearing disorders (audiologist). You may be asked questions about your symptoms and how they affect your daily life. You may have other tests done, such as:  CT scan.  MRI.  An imaging test of how blood flows through your blood vessels (angiogram). How is this treated? Treating an underlying medical condition can sometimes make tinnitus go away. If your tinnitus continues, other  treatments may include:  Medicines.  Therapy and counseling to help you manage the stress of living with tinnitus.  Sound generators to mask the tinnitus. These include: ? Tabletop sound machines that play relaxing sounds to help you fall asleep. ? Wearable devices that fit in your ear and play sounds or music. ? Acoustic neural stimulation. This involves using headphones to listen to music that contains an auditory signal. Over time, listening to this signal may change some pathways in your brain and make you less sensitive to tinnitus. This treatment is used for very severe cases when no other treatment is working.  Using hearing aids or cochlear implants if your tinnitus is related to hearing loss. Hearing aids are worn in the outer ear. Cochlear implants are surgically placed in the inner ear. Follow these instructions at home: Managing symptoms  When possible, avoid being in loud places and being exposed to loud sounds.  Wear hearing protection, such as earplugs, when you are exposed to loud noises.  Use a white noise machine, a humidifier, or other devices to mask the sound of tinnitus.  Practice techniques for reducing stress, such as meditation, yoga, or deep breathing. Work with your health care provider if you need help with managing stress.  Sleep with your head slightly raised. This may reduce the impact of tinnitus.      General instructions  Do not use stimulants, such as nicotine, alcohol, or caffeine. Talk with your health care provider about other stimulants to avoid. Stimulants are substances that can make you feel alert and attentive by increasing certain activities in the body (such as heart rate and blood pressure). These substances may make tinnitus worse.  Take over-the-counter and prescription medicines only as told by your health care provider.  Try to get plenty of sleep each night.  Keep all follow-up visits as told by your health care provider. This is  important. Contact a health care provider if:  Your tinnitus continues for 3 weeks or longer without stopping.  You develop sudden hearing loss.  Your symptoms get worse or do not get better with home care.  You feel you are not able to manage the stress of living with tinnitus. Get help right away if:  You develop tinnitus after a head injury.  You have tinnitus along with any of the following: ? Dizziness. ? Loss of balance. ? Nausea and vomiting. ? Sudden, severe headache. These symptoms may represent a serious problem that is an emergency. Do not wait to see if the symptoms will go away. Get medical help right away. Call your local emergency services (911 in the [place] Do not drive yourself to the hospital.  Summary  Tinnitus refers to hearing a sound when there is no actual source for that sound. This is often described as ringing in the ears.  Symptoms may affect only one ear (unilateral) or both ears (bilateral).  Use a white noise machine, a humidifier, or other devices to mask the sound of tinnitus.  Do not use stimulants, such as nicotine, alcohol, or caffeine. Talk with your health care provider about other stimulants to avoid. These substances may make tinnitus worse. This information is not intended to replace advice given to you by your health care provider. Make sure you discuss any questions you have with your health care provider. Document Revised: 07/28/2018 Document Reviewed: 10/24/2016 Elsevier Patient Education  2021 [employer].

## 2020-06-05 NOTE — Progress Notes (Signed)
Subjective:  Patient ID: Shaun Lee, male    DOB: 03/02/1955  Age: 65 y.o. MRN: [id number]  CC:  Chief Complaint  Patient presents with  . Annual Exam    Pt has ringing in the ears starting 1 week ago, thought this may be related to tadafil started in August as this is a side effect of that med but spoke with provider who started this med for him, they state it is unlikely. Pt would be willing to see ENT if recommended   Pt has not had shingrix of pneumonia vaccines, pt notes willing to start these today.     HPI Shaun Lee presents for   Wellness exam/Physical with other concerns above.  Last evaluated him in 2020 for acute issue, previous physical with me in 2017.  Last physical with Shaun Lee in 2020. Care team: PCP, Shaun Ruddy, NP (will change back to me - he thought I was still PCP) Urology: prior [place] - now Shaun Lee.  Shaun Lee - treating for torn meniscus. Doing ok, not limiting activity.  Hyperlipidemia: Statin recommended in past. Concern for side effects. Has tried to adjust diet, and now filtering coffee.  Discussed CCS depending on levels - he would consider that test.  The 10-year ASCVD risk score Shaun Lee., et al., 2013) is: 11.2%   Values used to calculate the score:     Age: 12 years     Sex: Male     Is Non-Hispanic African American: No     Diabetic: No     Tobacco smoker: No     Systolic Blood Pressure: 123456 mmHg     Is BP treated: No     HDL Cholesterol: 47 mg/dL     Total Cholesterol: 171 mg/dL  Lab Results  Component Value Date   CHOL 171 06/05/2020   HDL 47.00 06/05/2020   LDLCALC 110 (H) 06/05/2020   TRIG 70.0 06/05/2020   CHOLHDL 4 06/05/2020   Lab Results  Component Value Date   ALT 18 06/05/2020   AST 21 06/05/2020   ALKPHOS 60 06/05/2020   BILITOT 1.3 (H) 06/05/2020    History of BPH with urinary obstruction: Treated with Flomax, and finasteride. Followed by urology, Shaun Lee. With history of prostate nodule,  elevated PSA.  Last visit with urology in November, 60-month follow-up planned.  PSA 4.47 on 12/01/2019.  TURP has also been discussed.appt with urology in few days.   Tinnitus: Started 1 week ago. Both sides -constant high pitch. Improved over the weekend and returned yesterday.  No ear pain. No recent new loud exposure.  No change in hearing.  No ear discharge/drainage.  Takes tadalafil for prostate and ED past 6 months. Stopped tadalafil last week.  He has been seen by Shaun Lee in 2020 for a tonsillar cyst. No recent visit.   Asthma Has been treated with Pulmicort Flexhaler 180 mcg per actuation, 2 puffsTwice daily.  Albuterol as needed. Better since retiring a year ago. Not needing pulmicort and flonase except during pollen season for 2 weeks. Has albuterol - not needed recently.   Fall Risk  06/05/2020 07/10/2018 05/13/2018 09/07/2015  Falls in the past year? 0 0 0 No  Follow up Falls evaluation completed - - -  loose rugs in home - none Lighting in home: adequate.  Stairs: none.  Grab bars in bathroom - none  Depression screen [facility] 2/9 06/05/2020 07/10/2018 05/13/2018 09/07/2015  Decreased Interest 0 0 0 0  Down,  Depressed, Hopeless 0 0 0 0  PHQ - 2 Score 0 0 0 0   Cancer Screening: Colon - no FH of personal hx of CA/polyps, bleeding. Colonoscopy in 2008 - normal.  Screening options with colonoscopy versus Cologuard discussed. Discussed timing of repeat testing intervals if normal, as well as potential need for diagnostic Colonoscopy if positive Cologuard. Understanding expressed, and chose Cologuard.   Immunization History  Administered Date(s) Administered  . Influenza-Unspecified 12/04/2018  . PFIZER Comirnaty(Gray Top)Covid-19 Tri-Sucrose Vaccine 04/11/2019, 05/01/2019, 11/16/2019  . Pneumococcal Conjugate-13 06/05/2020  . Tdap 09/07/2015  . Zoster Recombinat (Shingrix) 06/05/2020  covid vaccine 04/11/19, 05/01/19, 11/16/19 - Shaun Lee Shingles vaccine:has not had - agrees today.   Pneumonia vaccine: today.   Functional Status Survey: Is the patient deaf or have difficulty hearing?: No Does the patient have difficulty seeing, even when wearing glasses/contacts?: No Does the patient have difficulty concentrating, remembering, or making decisions?: No Does the patient have difficulty walking or climbing stairs?: No Does the patient have difficulty dressing or bathing?: No Does the patient have difficulty doing errands alone such as visiting a doctor's office or shopping?: No  6CIT Screen 06/05/2020  What Year? 0 points  What month? 0 points  What time? 0 points  Count back from 20 0 points  Months in reverse 0 points  Repeat phrase 0 points  Total Score 0   [place] Office Visit from 06/05/2020 in [facility]  AUDIT-C Score 0     No exam data present Last optho visit about a year and a half ago - reading glasses only.   Dental: finding new dentist - usually exams every 6 months. looking for someone in network.   Exercise: biking 2 times per week. Walking on other day. Over 150 min per week.   Advanced directives: he has advanced directives - has living will, HCPOA.     History Patient Active Problem List   Diagnosis Date Noted  . Asthma 05/13/2018  . Allergic rhinitis 05/13/2018  . Inguinal hernia, right 07/28/2013  . Spermatocele of epididymis, single 07/28/2013   Past Medical History:  Diagnosis Date  . Allergy   . Asthma   . Cancer [facility])    skin cancer   Past Surgical History:  Procedure Laterality Date  . HERNIA REPAIR     No Known Allergies Prior to Admission medications   Medication Sig Start Date End Date Taking? Authorizing Provider  albuterol (VENTOLIN HFA) 108 (90 Base) MCG/ACT inhaler INHALE 2 PUFFS INTO THE LUNGS EVERY 4 (FOUR) HOURS AS NEEDED FOR WHEEZING OR SHORTNESS OF BREATH (COUGH, SHORTNESS OF BREATH OR WHEEZING.). 03/30/19  Yes Wendie Agreste, MD  Brimonidine Tartrate 0.33 %  GEL Apply 1 drop topically daily. 07/10/18  Yes Maximiano Coss, NP  Fexofenadine HCl (ALLEGRA PO) Take by mouth.   Yes [provider]  finasteride (PROSCAR) 5 MG tablet Take 5 mg by mouth daily. 03/23/20  Yes [provider]  fluticasone (FLONASE) 50 MCG/ACT nasal spray Place 1-2 sprays into both nostrils daily. 09/07/15  Yes Wendie Agreste, MD  metroNIDAZOLE (METROGEL) 0.75 % gel Apply 1 application topically 2 (two) times daily. 07/14/18  Yes Maximiano Coss, NP  PULMICORT Wannetta Sender 180 MCG/ACT inhaler TAKE 2 PUFFS BY MOUTH TWICE A DAY 03/30/19  Yes Wendie Agreste, MD  tamsulosin (FLOMAX) 0.4 MG CAPS capsule Take 0.8 mg by mouth in the morning and at bedtime. 05/12/20  Yes [provider]   Social History  Socioeconomic History  . Marital status: Married    Spouse name: Not on file  . Number of children: 1  . Years of education: Not on file  . Highest education level: Not on file  Occupational History  . Not on file  Tobacco Use  . Smoking status: Never Smoker  . Smokeless tobacco: Never Used  Vaping Use  . Vaping Use: Never used  Substance and Sexual Activity  . Alcohol use: Yes    Comment: rare occasion   . Drug use: No  . Sexual activity: Not Currently  Other Topics Concern  . Not on file  Social History Narrative  . Not on file   Social Determinants of Health   Financial Resource Strain: Not on file  Food Insecurity: Not on file  Transportation Needs: Not on file  Physical Activity: Not on file  Stress: Not on file  Social Connections: Not on file  Intimate Partner Violence: Not on file    Review of Systems .per HPI  Objective:   Vitals:   06/05/20 1042  BP: 118/62  Pulse: 84  Resp: 16  Temp: 98.1 F (36.7 C)  TempSrc: Temporal  SpO2: 97%  Weight: 155 lb 9.6 oz (70.6 kg)  Height: 5' 11.6" (1.819 m)     Physical Exam Vitals reviewed.  Constitutional:      Appearance: He is well-developed.  HENT:     Head:  Normocephalic and atraumatic.     Right Ear: Tympanic membrane, ear canal and external ear normal.     Left Ear: Tympanic membrane, ear canal and external ear normal.  Eyes:     Conjunctiva/sclera: Conjunctivae normal.     Pupils: Pupils are equal, round, and reactive to light.  Neck:     Thyroid: No thyromegaly.  Cardiovascular:     Rate and Rhythm: Normal rate and regular rhythm.     Heart sounds: Normal heart sounds.  Pulmonary:     Effort: Pulmonary effort is normal. No respiratory distress.     Breath sounds: Normal breath sounds. No wheezing.  Abdominal:     General: There is no distension.     Palpations: Abdomen is soft.     Tenderness: There is no abdominal tenderness.     Hernia: No hernia is present.  Musculoskeletal:        General: No tenderness. Normal range of motion.     Cervical back: Normal range of motion and neck supple.  Lymphadenopathy:     Cervical: No cervical adenopathy.  Skin:    General: Skin is warm and dry.  Neurological:     Mental Status: He is alert and oriented to person, place, and time.     Deep Tendon Reflexes: Reflexes are normal and symmetric.  Psychiatric:        Behavior: Behavior normal.       Assessment & Plan:  Shaun Lee is a 65 y.o. male . Encounter for Medicare annual wellness exam  - - anticipatory guidance as below in AVS, screening labs if needed. Health maintenance items as above in HPI discussed/recommended as applicable.  - no concerning responses on depression, fall, or functional status screening. Any positive responses noted as above. Advanced directives discussed as in CHL.   Hyperlipidemia, unspecified hyperlipidemia type - Plan: Comprehensive metabolic panel, Lipid panel  - check labs, consider coronary calcium scoring depending on ASCVD risk score to help decide on statin.  Screening for diabetes mellitus - Plan: Hemoglobin A1c Family history of diabetes  mellitus - Plan: Hemoglobin A1c  Need for pneumococcal  vaccination - Plan: Pneumococcal conjugate vaccine 13-valent Given  Need for shingles vaccine - Plan: Varicella-zoster vaccine IM given  Screen for colon cancer - Plan: Cologuard  Tinnitus aurium, bilateral - Plan: Ambulatory referral to ENT  - bilateral symptoms. Less likely tadalafil, but ok to hold on that for now. Refer to ENT.   No orders of the defined types were placed in this encounter.  Patient Instructions   Pneumonia vaccine and 1st shingles vaccine today.  Next pneumonia vaccine in 1 year.  I will refer you to ENT.    Return to the clinic or go to the nearest emergency room if any of your symptoms worsen or new symptoms occur.  Preventive Care 90 Years and Older, Male Preventive care refers to lifestyle choices and visits with your health care provider that can promote health and wellness. This includes:  A yearly physical exam. This is also called an annual wellness visit.  Regular dental and eye exams.  Immunizations.  Screening for certain conditions.  Healthy lifestyle choices, such as: ? Eating a healthy diet. ? Getting regular exercise. ? Not using drugs or products that contain nicotine and tobacco. ? Limiting alcohol use. What can I expect for my preventive care visit? Physical exam Your health care provider will check your:  Height and weight. These may be used to calculate your BMI (body mass index). BMI is a measurement that tells if you are at a healthy weight.  Heart rate and blood pressure.  Body temperature.  Skin for abnormal spots. Counseling Your health care provider may ask you questions about your:  Past medical problems.  Family's medical history.  Alcohol, tobacco, and drug use.  Emotional well-being.  Home life and relationship well-being.  Sexual activity.  Diet, exercise, and sleep habits.  History of falls.  Memory and ability to understand (cognition).  Work and work Statistician.  Access to firearms. What  immunizations do I need? Vaccines are usually given at various ages, according to a schedule. Your health care provider will recommend vaccines for you based on your age, medical history, and lifestyle or other factors, such as travel or where you work.   What tests do I need? Blood tests  Lipid and cholesterol levels. These may be checked every 5 years, or more often depending on your overall health.  Hepatitis C test.  Hepatitis B test. Screening  Lung cancer screening. You may have this screening every year starting at age 40 if you have a 30-pack-year history of smoking and currently smoke or have quit within the past 15 years.  Colorectal cancer screening. ? All adults should have this screening starting at age 38 and continuing until age 43. ? Your health care provider may recommend screening at age 34 if you are at increased risk. ? You will have tests every 1-10 years, depending on your results and the type of screening test.  Prostate cancer screening. Recommendations will vary depending on your family history and other risks.  Genital exam to check for testicular cancer or hernias.  Diabetes screening. ? This is done by checking your blood sugar (glucose) after you have not eaten for a while (fasting). ? You may have this done every 1-3 years.  Abdominal aortic aneurysm (AAA) screening. You may need this if you are a current or former smoker.  STD (sexually transmitted disease) testing, if you are at risk. Follow these instructions at home: Eating and drinking  Eat a diet that includes fresh fruits and vegetables, whole grains, lean protein, and low-fat dairy products. Limit your intake of foods with high amounts of sugar, saturated fats, and salt.  Take vitamin and mineral supplements as recommended by your health care provider.  Do not drink alcohol if your health care provider tells you not to drink.  If you drink alcohol: ? Limit how much you have to 0-2 drinks a  day. ? Be aware of how much alcohol is in your drink. In the [place] one drink equals one 12 oz bottle of beer (355 mL), one 5 oz glass of wine (148 mL), or one 1 oz glass of hard liquor (44 mL).   Lifestyle  Take daily care of your teeth and gums. Brush your teeth every morning and night with fluoride toothpaste. Floss one time each day.  Stay active. Exercise for at least 30 minutes 5 or more days each week.  Do not use any products that contain nicotine or tobacco, such as cigarettes, e-cigarettes, and chewing tobacco. If you need help quitting, ask your health care provider.  Do not use drugs.  If you are sexually active, practice safe sex. Use a condom or other form of protection to prevent STIs (sexually transmitted infections).  Talk with your health care provider about taking a low-dose aspirin or statin.  Find healthy ways to cope with stress, such as: ? Meditation, yoga, or listening to music. ? Journaling. ? Talking to a trusted person. ? Spending time with friends and family. Safety  Always wear your seat belt while driving or riding in a vehicle.  Do not drive: ? If you have been drinking alcohol. Do not ride with someone who has been drinking. ? When you are tired or distracted. ? While texting.  Wear a helmet and other protective equipment during sports activities.  If you have firearms in your house, make sure you follow all gun safety procedures. What's next?  Visit your health care provider once a year for an annual wellness visit.  Ask your health care provider how often you should have your eyes and teeth checked.  Stay up to date on all vaccines. This information is not intended to replace advice given to you by your health care provider. Make sure you discuss any questions you have with your health care provider. Document Revised: 10/13/2018 Document Reviewed: 01/08/2018 Elsevier Patient Education  2021 [employer].  Tinnitus Tinnitus refers to  hearing a sound when there is no actual source for that sound. This is often described as ringing in the ears. However, people with this condition may hear a variety of noises, in one ear or in both ears. The sounds of tinnitus can be soft, loud, or somewhere in between. Tinnitus can last for a few seconds or can be constant for days. It may go away without treatment and come back at various times. When tinnitus is constant or happens often, it can lead to other problems, such as trouble sleeping and trouble concentrating. Almost everyone experiences tinnitus at some point. Tinnitus that is long-lasting (chronic) or comes back often (recurs) may require medical attention. What are the causes? The cause of tinnitus is often not known. In some cases, it can result from:  Exposure to loud noises from machinery, music, or other sources.  An object (foreign body) stuck in the ear.  Earwax buildup.  Drinking alcohol or caffeine.  Taking certain medicines.  Age-related hearing loss. It may also be caused by  medical conditions such as:  Ear or sinus infections.  High blood pressure.  Heart diseases.  Anemia.  Allergies.  Meniere's disease.  Thyroid problems.  Tumors.  A weak, bulging blood vessel (aneurysm) near the ear. What are the signs or symptoms? The main symptom of tinnitus is hearing a sound when there is no source for that sound. It may sound like:  Buzzing.  Roaring.  Ringing.  Blowing air.  Hissing.  Whistling.  Sizzling.  Humming.  Running water.  A musical note.  Tapping. Symptoms may affect only one ear (unilateral) or both ears (bilateral). How is this diagnosed? Tinnitus is diagnosed based on your symptoms, your medical history, and a physical exam. Your health care provider may do a thorough hearing test (audiologic exam) if your tinnitus:  Is unilateral.  Causes hearing difficulties.  Lasts 6 months or longer. You may work with a health  care provider who specializes in hearing disorders (audiologist). You may be asked questions about your symptoms and how they affect your daily life. You may have other tests done, such as:  CT scan.  MRI.  An imaging test of how blood flows through your blood vessels (angiogram). How is this treated? Treating an underlying medical condition can sometimes make tinnitus go away. If your tinnitus continues, other treatments may include:  Medicines.  Therapy and counseling to help you manage the stress of living with tinnitus.  Sound generators to mask the tinnitus. These include: ? Tabletop sound machines that play relaxing sounds to help you fall asleep. ? Wearable devices that fit in your ear and play sounds or music. ? Acoustic neural stimulation. This involves using headphones to listen to music that contains an auditory signal. Over time, listening to this signal may change some pathways in your brain and make you less sensitive to tinnitus. This treatment is used for very severe cases when no other treatment is working.  Using hearing aids or cochlear implants if your tinnitus is related to hearing loss. Hearing aids are worn in the outer ear. Cochlear implants are surgically placed in the inner ear. Follow these instructions at home: Managing symptoms  When possible, avoid being in loud places and being exposed to loud sounds.  Wear hearing protection, such as earplugs, when you are exposed to loud noises.  Use a white noise machine, a humidifier, or other devices to mask the sound of tinnitus.  Practice techniques for reducing stress, such as meditation, yoga, or deep breathing. Work with your health care provider if you need help with managing stress.  Sleep with your head slightly raised. This may reduce the impact of tinnitus.      General instructions  Do not use stimulants, such as nicotine, alcohol, or caffeine. Talk with your health care provider about other  stimulants to avoid. Stimulants are substances that can make you feel alert and attentive by increasing certain activities in the body (such as heart rate and blood pressure). These substances may make tinnitus worse.  Take over-the-counter and prescription medicines only as told by your health care provider.  Try to get plenty of sleep each night.  Keep all follow-up visits as told by your health care provider. This is important. Contact a health care provider if:  Your tinnitus continues for 3 weeks or longer without stopping.  You develop sudden hearing loss.  Your symptoms get worse or do not get better with home care.  You feel you are not able to manage the stress of  living with tinnitus. Get help right away if:  You develop tinnitus after a head injury.  You have tinnitus along with any of the following: ? Dizziness. ? Loss of balance. ? Nausea and vomiting. ? Sudden, severe headache. These symptoms may represent a serious problem that is an emergency. Do not wait to see if the symptoms will go away. Get medical help right away. Call your local emergency services (911 in the [place] Do not drive yourself to the hospital. Summary  Tinnitus refers to hearing a sound when there is no actual source for that sound. This is often described as ringing in the ears.  Symptoms may affect only one ear (unilateral) or both ears (bilateral).  Use a white noise machine, a humidifier, or other devices to mask the sound of tinnitus.  Do not use stimulants, such as nicotine, alcohol, or caffeine. Talk with your health care provider about other stimulants to avoid. These substances may make tinnitus worse. This information is not intended to replace advice given to you by your health care provider. Make sure you discuss any questions you have with your health care provider. Document Revised: 07/28/2018 Document Reviewed: 10/24/2016 Elsevier Patient Education  2021 [place].      Signed, [name], MD [facility]

## 2020-06-06 ENCOUNTER — Encounter: Payer: Self-pay | Admitting: Family Medicine

## 2020-06-07 DIAGNOSIS — R351 Nocturia: Secondary | ICD-10-CM | POA: Diagnosis not present

## 2020-06-07 DIAGNOSIS — R3912 Poor urinary stream: Secondary | ICD-10-CM | POA: Diagnosis not present

## 2020-06-07 DIAGNOSIS — R972 Elevated prostate specific antigen [PSA]: Secondary | ICD-10-CM | POA: Diagnosis not present

## 2020-06-07 DIAGNOSIS — N403 Nodular prostate with lower urinary tract symptoms: Secondary | ICD-10-CM | POA: Diagnosis not present

## 2020-06-12 ENCOUNTER — Other Ambulatory Visit: Payer: Self-pay | Admitting: Urology

## 2020-06-12 DIAGNOSIS — R972 Elevated prostate specific antigen [PSA]: Secondary | ICD-10-CM

## 2020-06-21 ENCOUNTER — Other Ambulatory Visit: Payer: Self-pay

## 2020-06-21 ENCOUNTER — Ambulatory Visit (INDEPENDENT_AMBULATORY_CARE_PROVIDER_SITE_OTHER): Payer: Medicare Other | Admitting: Otolaryngology

## 2020-06-21 DIAGNOSIS — H9313 Tinnitus, bilateral: Secondary | ICD-10-CM | POA: Diagnosis not present

## 2020-06-21 DIAGNOSIS — H903 Sensorineural hearing loss, bilateral: Secondary | ICD-10-CM | POA: Diagnosis not present

## 2020-06-21 NOTE — Progress Notes (Signed)
HPI: Shaun Lee is a 65 y.o. male who returns today for evaluation of recent onset of tinnitus in both ears that started about a month ago.  When he woke up 1 morning with tinnitus in both ears.  This started shortly after taking medication for enlarged prostate prescribed by Dr. Jeffie Pollock.  He has not noted any hearing problems but has noticed ringing in both ears that he has not had previously to a month ago. 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  He had annual hearing test that did not demonstrate any hearing loss..  Past Medical History:  Diagnosis Date  . Allergy   . Asthma   . Cancer [facility])    skin cancer   Past Surgical History:  Procedure Laterality Date  . HERNIA REPAIR     Social History   Socioeconomic History  . Marital status: Married    Spouse name: Not on file  . Number of children: 1  . Years of education: Not on file  . Highest education level: Not on file  Occupational History  . Not on file  Tobacco Use  . Smoking status: Never Smoker  . Smokeless tobacco: Never Used  Vaping Use  . Vaping Use: Never used  Substance and Sexual Activity  . Alcohol use: Yes    Comment: rare occasion   . Drug use: No  . Sexual activity: Not Currently  Other Topics Concern  . Not on file  Social History Narrative  . Not on file   Social Determinants of Health   Financial Resource Strain: Not on file  Food Insecurity: Not on file  Transportation Needs: Not on file  Physical Activity: Not on file  Stress: Not on file  Social Connections: Not on file   Family History  Problem Relation Age of Onset  . Diabetes Mother   . Hypertension Mother   . Kidney disease Father    No Known Allergies Prior to Admission medications   Medication Sig Start Date End Date Taking? Authorizing Provider  albuterol (VENTOLIN HFA) 108 (90 Base) MCG/ACT inhaler INHALE 2 PUFFS INTO THE LUNGS EVERY  4 (FOUR) HOURS AS NEEDED FOR WHEEZING OR SHORTNESS OF BREATH (COUGH, SHORTNESS OF BREATH OR WHEEZING.). 03/30/19   Wendie Agreste, MD  Brimonidine Tartrate 0.33 % GEL Apply 1 drop topically daily. 07/10/18   Maximiano Coss, NP  Fexofenadine HCl (ALLEGRA PO) Take by mouth.    [provider]  finasteride (PROSCAR) 5 MG tablet Take 5 mg by mouth daily. 03/23/20   [provider]  fluticasone (FLONASE) 50 MCG/ACT nasal spray Place 1-2 sprays into both nostrils daily. 09/07/15   Wendie Agreste, MD  metroNIDAZOLE (METROGEL) 0.75 % gel Apply 1 application topically 2 (two) times daily. 07/14/18   Maximiano Coss, NP  PULMICORT Wannetta Sender 180 MCG/ACT inhaler TAKE 2 PUFFS BY MOUTH TWICE A DAY 03/30/19   Wendie Agreste, MD  tamsulosin (FLOMAX) 0.4 MG CAPS capsule Take 0.8 mg by mouth in the morning and at bedtime. 05/12/20   [provider]     Positive ROS: Otherwise negative  All other systems have been reviewed and were otherwise negative with the exception of those mentioned in the HPI and as above.  Physical Exam: Constitutional: Alert, well-appearing, no acute distress Ears: External ears without lesions or tenderness. Ear canals are clear bilaterally with intact, clear TMs bilaterally. Nasal: External  nose without lesions. Septum with minimal deformity.. Clear nasal passages otherwise. Oral: Lips and gums without lesions. Tongue and palate mucosa without lesions. Posterior oropharynx clear. Neck: No palpable adenopathy or masses Respiratory: Breathing comfortably  Skin: No facial/neck lesions or rash noted.  Audiologic testing demonstrated downsloping symmetric sensorineural hearing loss in both ears starting at 4000 frequency and above.  This is consistent with presbycusis.  He had normal hearing in the mid and lower frequencies up to 3000 frequency.  SRT's were 10 dB bilaterally.  He had type A tympanograms bilaterally.  Procedures  Assessment: Recent onset of  tinnitus questionable etiology.  Most likely consistent with presbycusis.  He inquired as to whether the medication he took may have contributed to this as this is a possible side effect.  Discussed with him that it would be hard to know as there is no previous audiogram to compare this to.  Plan: I discussed with him that findings are more consistent with presbycusis or age-related hearing loss. Cautioned him about using ear protection when around loud noise which he does. Also discussed with him concerning using masking noise when the tinnitus is bothersome although it has not been that bothersome. He will follow-up as needed if he notices any worsening of the tinnitus or change in hearing.   Radene Journey, MD

## 2020-07-02 ENCOUNTER — Ambulatory Visit
Admission: RE | Admit: 2020-07-02 | Discharge: 2020-07-02 | Disposition: A | Discharge status: Home / Self Care | Payer: Medicare Other | Source: Ambulatory Visit | Attending: Urology | Admitting: Urology

## 2020-07-02 DIAGNOSIS — R972 Elevated prostate specific antigen [PSA]: Secondary | ICD-10-CM | POA: Diagnosis not present

## 2020-07-02 DIAGNOSIS — R590 Localized enlarged lymph nodes: Secondary | ICD-10-CM | POA: Diagnosis not present

## 2020-07-02 DIAGNOSIS — N400 Enlarged prostate without lower urinary tract symptoms: Secondary | ICD-10-CM | POA: Diagnosis not present

## 2020-07-02 MED ORDER — GADOBENATE DIMEGLUMINE 529 MG/ML IV SOLN
15.0000 mL | Freq: Once | INTRAVENOUS | Status: AC | PRN
  Administered 2020-07-02: 15 mL via INTRAVENOUS

## 2020-08-08 ENCOUNTER — Telehealth: Payer: Self-pay | Admitting: Family Medicine

## 2020-08-08 NOTE — Telephone Encounter (Signed)
Spoke with [place]. [place] number given for reimbursement of claims.

## 2020-08-08 NOTE — Telephone Encounter (Signed)
[employer] need the [facility] for the zoster vaccine - please advise.

## 2020-10-12 ENCOUNTER — Telehealth: Payer: Self-pay

## 2020-10-12 DIAGNOSIS — R17 Unspecified jaundice: Secondary | ICD-10-CM

## 2020-10-12 NOTE — Telephone Encounter (Signed)
Spoke to patient he is coming in tomorrow having his second shingle shot and he has inquired about having his bilirubin rechecked as well? I had Shaun Lee check for me she wanted me to get with you on this. Also he is wanting to know if he schedules that to be done as well will he need to fast?  Pt call back [phone]

## 2020-10-13 ENCOUNTER — Other Ambulatory Visit: Payer: Self-pay

## 2020-10-13 ENCOUNTER — Ambulatory Visit (INDEPENDENT_AMBULATORY_CARE_PROVIDER_SITE_OTHER): Payer: Medicare Other | Admitting: Family Medicine

## 2020-10-13 ENCOUNTER — Encounter: Payer: Self-pay | Admitting: Family Medicine

## 2020-10-13 DIAGNOSIS — Z23 Encounter for immunization: Secondary | ICD-10-CM

## 2020-10-13 NOTE — Progress Notes (Signed)
Patient presents to the office for his second shingles vaccine. Patient received vaccine in his rt deltoid and tolerated well.

## 2020-10-17 NOTE — Telephone Encounter (Signed)
Pt was askig about having Billi checked lab only visit I do not see a recent request or pended orders for this please advise if you wish to proceed

## 2020-10-17 NOTE — Progress Notes (Signed)
Agree with order as given.  Signed,   [name], MD [place] 10/17/20 12:44 PM

## 2020-10-20 NOTE — Addendum Note (Signed)
Addended by: [name] R on: 10/20/2020 08:06 AM   Modules accepted: Orders

## 2020-10-20 NOTE — Telephone Encounter (Signed)
See last lab work, borderline elevated bilirubin, plan for recheck in 3 to 6 months.  Lab only order entered.  Please schedule lab visit.

## 2020-10-30 ENCOUNTER — Telehealth: Payer: Self-pay

## 2020-10-30 NOTE — Telephone Encounter (Signed)
Travontae come in for Phy on 05/09 and in discussion he was going to do Colorguard for colonoscopy and he never heard anything back and he got a health screen call to remind him of his colonoscopy   Pt call back [phone]

## 2020-10-31 ENCOUNTER — Other Ambulatory Visit: Payer: Self-pay

## 2020-10-31 DIAGNOSIS — Z1211 Encounter for screening for malignant neoplasm of colon: Secondary | ICD-10-CM

## 2020-10-31 NOTE — Telephone Encounter (Signed)
Returned patients call and informed him that his cologuard had been ordered on 06/05/20 and that I was not sure why he had not heard from anyone about it. I reordered it and advised him to call us back and let us know if have not heard from anything in the next few days. Patient understood. No further concerns at this time.

## 2020-11-06 DIAGNOSIS — Z1211 Encounter for screening for malignant neoplasm of colon: Secondary | ICD-10-CM | POA: Diagnosis not present

## 2020-11-11 LAB — COLOGUARD: Cologuard: NEGATIVE

## 2020-11-20 ENCOUNTER — Encounter (INDEPENDENT_AMBULATORY_CARE_PROVIDER_SITE_OTHER): Payer: Self-pay

## 2020-11-30 ENCOUNTER — Other Ambulatory Visit: Payer: Self-pay

## 2020-11-30 ENCOUNTER — Other Ambulatory Visit (INDEPENDENT_AMBULATORY_CARE_PROVIDER_SITE_OTHER): Payer: Medicare Other

## 2020-11-30 DIAGNOSIS — R17 Unspecified jaundice: Secondary | ICD-10-CM

## 2020-11-30 LAB — HEPATIC FUNCTION PANEL
ALT: 21 U/L (ref 0–53)
AST: 24 U/L (ref 0–37)
Albumin: 4.3 g/dL (ref 3.5–5.2)
Alkaline Phosphatase: 54 U/L (ref 39–117)
Bilirubin, Direct: 0.1 mg/dL (ref 0.0–0.3)
Total Bilirubin: 0.9 mg/dL (ref 0.2–1.2)
Total Protein: 6.9 g/dL (ref 6.0–8.3)

## 2020-12-06 DIAGNOSIS — R972 Elevated prostate specific antigen [PSA]: Secondary | ICD-10-CM | POA: Diagnosis not present

## 2020-12-13 DIAGNOSIS — N5201 Erectile dysfunction due to arterial insufficiency: Secondary | ICD-10-CM | POA: Diagnosis not present

## 2020-12-13 DIAGNOSIS — R972 Elevated prostate specific antigen [PSA]: Secondary | ICD-10-CM | POA: Diagnosis not present

## 2020-12-13 DIAGNOSIS — N403 Nodular prostate with lower urinary tract symptoms: Secondary | ICD-10-CM | POA: Diagnosis not present

## 2020-12-13 DIAGNOSIS — R3912 Poor urinary stream: Secondary | ICD-10-CM | POA: Diagnosis not present

## 2021-06-20 DIAGNOSIS — R972 Elevated prostate specific antigen [PSA]: Secondary | ICD-10-CM | POA: Diagnosis not present

## 2021-06-27 DIAGNOSIS — R3912 Poor urinary stream: Secondary | ICD-10-CM | POA: Diagnosis not present

## 2021-06-27 DIAGNOSIS — R351 Nocturia: Secondary | ICD-10-CM | POA: Diagnosis not present

## 2021-06-27 DIAGNOSIS — R972 Elevated prostate specific antigen [PSA]: Secondary | ICD-10-CM | POA: Diagnosis not present

## 2021-06-27 DIAGNOSIS — N403 Nodular prostate with lower urinary tract symptoms: Secondary | ICD-10-CM | POA: Diagnosis not present

## 2021-07-11 ENCOUNTER — Ambulatory Visit (INDEPENDENT_AMBULATORY_CARE_PROVIDER_SITE_OTHER): Payer: Medicare Other

## 2021-07-11 DIAGNOSIS — Z0000 Encounter for general adult medical examination without abnormal findings: Secondary | ICD-10-CM

## 2021-07-11 NOTE — Progress Notes (Signed)
Subjective:   Shaun Lee is a 66 y.o. male who presents for an subsequent  Medicare Annual Wellness Visit.   I connected with Shaun Lee  today by telephone and verified that I am speaking with the correct person using two identifiers. Location patient: home Location provider: work Persons participating in the virtual visit: patient, provider.   I discussed the limitations, risks, security and privacy concerns of performing an evaluation and management service by telephone and the availability of in person appointments. I also discussed with the patient that there may be a patient responsible charge related to this service. The patient expressed understanding and verbally consented to this telephonic visit.    Interactive audio and video telecommunications were attempted between this provider and patient, however failed, due to patient having technical difficulties OR patient did not have access to video capability.  We continued and completed visit with audio only.    Review of Systems           Objective:    Today's Vitals   There is no height or weight on file to calculate BMI.     07/11/2021    3:03 PM 06/05/2020   10:50 AM  Advanced Directives  Does Patient Have a Medical Advance Directive? Yes Yes  Type of Paramedic of [place];Living will   Does patient want to make changes to medical advance directive?  No - Patient declined  Copy of [place] in Chart? No - copy requested     Current Medications (verified) Outpatient Encounter Medications as of 07/11/2021  Medication Sig   albuterol (VENTOLIN HFA) 108 (90 Base) MCG/ACT inhaler INHALE 2 PUFFS INTO THE LUNGS EVERY 4 (FOUR) HOURS AS NEEDED FOR WHEEZING OR SHORTNESS OF BREATH (COUGH, SHORTNESS OF BREATH OR WHEEZING.).   Fexofenadine HCl (ALLEGRA PO) Take by mouth.   finasteride (PROSCAR) 5 MG tablet Take 5 mg by mouth daily.   fluticasone (FLONASE) 50 MCG/ACT nasal spray  Place 1-2 sprays into both nostrils daily.   metroNIDAZOLE (METROGEL) 0.75 % gel Apply 1 application topically 2 (two) times daily.   PULMICORT FLEXHALER 180 MCG/ACT inhaler TAKE 2 PUFFS BY MOUTH TWICE A DAY   tamsulosin (FLOMAX) 0.4 MG CAPS capsule Take 0.8 mg by mouth in the morning and at bedtime.   Brimonidine Tartrate 0.33 % GEL Apply 1 drop topically daily. (Patient not taking: Reported on 07/11/2021)   No facility-administered encounter medications on file as of 07/11/2021.    Allergies (verified) Patient has no known allergies.   History: Past Medical History:  Diagnosis Date   Allergy    Asthma    Cancer ([place])    skin cancer   Past Surgical History:  Procedure Laterality Date   HERNIA REPAIR     Family History  Problem Relation Age of Onset   Diabetes Mother    Hypertension Mother    Kidney disease Father    Social History   Socioeconomic History   Marital status: Married    Spouse name: Not on file   Number of children: 1   Years of education: Not on file   Highest education level: Not on file  Occupational History   Not on file  Tobacco Use   Smoking status: Never   Smokeless tobacco: Never  Vaping Use   Vaping Use: Never used  Substance and Sexual Activity   Alcohol use: Yes    Comment: rare occasion    Drug use: No  Sexual activity: Not Currently  Other Topics Concern   Not on file  Social History Narrative   Not on file   Social Determinants of Health   Financial Resource Strain: Low Risk  (07/11/2021)   Overall Financial Resource Strain (CARDIA)    Difficulty of Paying Living Expenses: Not hard at all  Food Insecurity: No Food Insecurity (07/11/2021)   Hunger Vital Sign    Worried About Running Out of Food in the Last Year: Never true    Ran Out of Food in the Last Year: Never true  Transportation Needs: No Transportation Needs (07/11/2021)   PRAPARE - Hydrologist (Medical): No    Lack of Transportation  (Non-Medical): No  Physical Activity: Sufficiently Active (07/11/2021)   Exercise Vital Sign    Days of Exercise per Week: 5 days    Minutes of Exercise per Session: 60 min  Stress: No Stress Concern Present (07/11/2021)   [place]    Feeling of Stress : Not at all  Social Connections: Moderately Integrated (07/11/2021)   Social Connection and Isolation Panel [NHANES]    Frequency of Communication with Friends and Family: Three times a week    Frequency of Social Gatherings with Friends and Family: Three times a week    Attends Religious Services: More than 4 times per year    Active Member of Clubs or Organizations: No    Attends Archivist Meetings: Never    Marital Status: Married    Tobacco Counseling Counseling given: Not Answered   Clinical Intake:  Pre-visit preparation completed: Yes  Pain : No/denies pain     Nutritional Risks: None Diabetes: No  How often do you need to have someone help you when you read instructions, pamphlets, or other written materials from your doctor or pharmacy?: 1 - Never What is the last grade level you completed in school?: [place]   Interpreter Needed?: No  Information entered by :: L.Braun Rocca,LPN   Activities of Daily Living     No data to display          Patient Care Team: Wendie Agreste, MD as PCP - General (Family Medicine)  Indicate any recent Medical Services you may have received from other than [facility] providers in the past year (date may be approximate).     Assessment:   This is a routine wellness examination for Shaun Lee.  Hearing/Vision screen Vision Screening - Comments:: Declined   Dietary issues and exercise activities discussed:     Goals Addressed   None    Depression Screen    07/11/2021    3:03 PM 07/11/2021    3:02 PM 06/05/2020   10:50 AM 07/10/2018    8:08 AM 05/13/2018    8:11 AM 09/07/2015    9:26  AM  PHQ 2/9 Scores  PHQ - 2 Score 0 0 0 0 0 0    Fall Risk    07/11/2021    3:03 PM 06/05/2020   10:50 AM 07/10/2018    8:08 AM 05/13/2018    8:11 AM 09/07/2015    9:26 AM  [place] in the past year? 0 0 0 0 No  Number falls in past yr: 0      Injury with Fall? 0      Risk for fall due to : No Fall Risks      Follow up Falls evaluation completed;Education provided  Falls evaluation completed       FALL RISK PREVENTION PERTAINING TO THE HOME:  Any stairs in or around the home? No  If so, are there any without handrails? No  Home free of loose throw rugs in walkways, pet beds, electrical cords, etc? Yes  Adequate lighting in your home to reduce risk of falls? Yes   ASSISTIVE DEVICES UTILIZED TO PREVENT FALLS:  Life alert? No  Use of a cane, walker or w/c? No  Grab bars in the bathroom? No  Shower chair or bench in shower? No  Elevated toilet seat or a handicapped toilet? No    Cognitive Function:    Normal cognitive status assessed by telephone conversation  by this Nurse Health Advisor. No abnormalities found.      06/05/2020   10:49 AM  6CIT Screen  What Year? 0 points  What month? 0 points  What time? 0 points  Count back from 20 0 points  Months in reverse 0 points  Repeat phrase 0 points  Total Score 0 points    Immunizations Immunization History  Administered Date(s) Administered   Influenza-Unspecified 12/04/2018   PFIZER Comirnaty(Gray Top)Covid-19 Tri-Sucrose Vaccine 04/11/2019, 05/01/2019, 11/16/2019   Pneumococcal Conjugate-13 06/05/2020   Tdap 09/07/2015   Zoster Recombinat (Shingrix) 06/05/2020, 10/13/2020    TDAP status: Up to date  Flu Vaccine status: Up to date  Pneumococcal vaccine status: Up to date  Covid-19 vaccine status: Completed vaccines  Qualifies for Shingles Vaccine? Yes   Zostavax completed Yes   Shingrix Completed?: Yes  Screening Tests Health Maintenance  Topic Date Due   COLONOSCOPY (Pts 45-28yr Insurance  coverage will need to be confirmed)  05/25/2016   COVID-19 Vaccine (4 - Pfizer series) 01/11/2020   Pneumonia Vaccine 67 Years old (2 - PPSV23 if available, else PCV20) 06/05/2021   INFLUENZA VACCINE  08/28/2021   TETANUS/TDAP  09/06/2025   Hepatitis C Screening  Completed   Zoster Vaccines- Shingrix  Completed   HPV VACCINES  Aged Out    Health Maintenance  Health Maintenance Due  Topic Date Due   COLONOSCOPY (Pts 45-424yrInsurance coverage will need to be confirmed)  05/25/2016   COVID-19 Vaccine (4 - Pfizer series) 01/11/2020   Pneumonia Vaccine 6543Years old (2 - PPSV23 if available, else PCV20) 06/05/2021    Colorectal cancer screening: Type of screening: Cologuard. Completed 06/05/2020. Repeat every 3 years  Lung Cancer Screening: (Low Dose CT Chest recommended if Age 67-80ears, 30 pack-year currently smoking OR have quit w/in 15years.) does not qualify.   Lung Cancer Screening Referral: n/a  Additional Screening:  Hepatitis C Screening: does not qualify; Completed 09/07/2015  Vision Screening: Recommended annual ophthalmology exams for early detection of glaucoma and other disorders of the eye. Is the patient up to date with their annual eye exam?  No  Who is the provider or what is the name of the office in which the patient attends annual eye exams? Declined  If pt is not established with a provider, would they like to be referred to a provider to establish care? No .   Dental Screening: Recommended annual dental exams for proper oral hygiene  Community Resource Referral / Chronic Care Management: CRR required this visit?  No   CCM required this visit?  No      Plan:     I have personally reviewed and noted the following in the patient's chart:   Medical and social history Use of alcohol, tobacco or illicit  drugs  Current medications and supplements including opioid prescriptions. Patient is not currently taking opioid prescriptions. Functional ability  and status Nutritional status Physical activity Advanced directives List of other physicians Hospitalizations, surgeries, and ER visits in previous 12 months Vitals Screenings to include cognitive, depression, and falls Referrals and appointments  In addition, I have reviewed and discussed with patient certain preventive protocols, quality metrics, and best practice recommendations. A written personalized care plan for preventive services as well as general preventive health recommendations were provided to patient.     Randel Pigg, LPN   [id number]   Nurse Notes: none

## 2021-07-11 NOTE — Patient Instructions (Signed)
Shaun Lee , Thank you for taking time to come for your Medicare Wellness Visit. I appreciate your ongoing commitment to your health goals. Please review the following plan we discussed and let me know if I can assist you in the future.   Screening recommendations/referrals: Colonoscopy: Cologuard 06/05/2020 Recommended yearly ophthalmology/optometry visit for glaucoma screening and checkup Recommended yearly dental visit for hygiene and checkup  Vaccinations: Influenza vaccine: completed  Pneumococcal vaccine: completed  Tdap vaccine: 09/07/2015 Shingles vaccine: completed     Advanced directives: yes   Conditions/risks identified: none   Next appointment: none   Preventive Care 50 Years and Older, Male Preventive care refers to lifestyle choices and visits with your health care provider that can promote health and wellness. What does preventive care include? A yearly physical exam. This is also called an annual well check. Dental exams once or twice a year. Routine eye exams. Ask your health care provider how often you should have your eyes checked. Personal lifestyle choices, including: Daily care of your teeth and gums. Regular physical activity. Eating a healthy diet. Avoiding tobacco and drug use. Limiting alcohol use. Practicing safe sex. Taking low doses of aspirin every day. Taking vitamin and mineral supplements as recommended by your health care provider. What happens during an annual well check? The services and screenings done by your health care provider during your annual well check will depend on your age, overall health, lifestyle risk factors, and family history of disease. Counseling  Your health care provider may ask you questions about your: Alcohol use. Tobacco use. Drug use. Emotional well-being. Home and relationship well-being. Sexual activity. Eating habits. History of falls. Memory and ability to understand (cognition). Work and work  Statistician. Screening  You may have the following tests or measurements: Height, weight, and BMI. Blood pressure. Lipid and cholesterol levels. These may be checked every 5 years, or more frequently if you are over 29 years old. Skin check. Lung cancer screening. You may have this screening every year starting at age 75 if you have a 30-pack-year history of smoking and currently smoke or have quit within the past 15 years. Fecal occult blood test (FOBT) of the stool. You may have this test every year starting at age 44. Flexible sigmoidoscopy or colonoscopy. You may have a sigmoidoscopy every 5 years or a colonoscopy every 10 years starting at age 56. Prostate cancer screening. Recommendations will vary depending on your family history and other risks. Hepatitis C blood test. Hepatitis B blood test. Sexually transmitted disease (STD) testing. Diabetes screening. This is done by checking your blood sugar (glucose) after you have not eaten for a while (fasting). You may have this done every 1-3 years. Abdominal aortic aneurysm (AAA) screening. You may need this if you are a current or former smoker. Osteoporosis. You may be screened starting at age 18 if you are at high risk. Talk with your health care provider about your test results, treatment options, and if necessary, the need for more tests. Vaccines  Your health care provider may recommend certain vaccines, such as: Influenza vaccine. This is recommended every year. Tetanus, diphtheria, and acellular pertussis (Tdap, Td) vaccine. You may need a Td booster every 10 years. Zoster vaccine. You may need this after age 65. Pneumococcal 13-valent conjugate (PCV13) vaccine. One dose is recommended after age 32. Pneumococcal polysaccharide (PPSV23) vaccine. One dose is recommended after age 41. Talk to your health care provider about which screenings and vaccines you need and how often you  need them. This information is not intended to replace  advice given to you by your health care provider. Make sure you discuss any questions you have with your health care provider. Document Released: 02/10/2015 Document Revised: 10/04/2015 Document Reviewed: 11/15/2014 Elsevier Interactive Patient Education  2017 [place] [employer] Prevention in the Home Falls can cause injuries. They can happen to people of all ages. There are many things you can do to make your home safe and to help prevent falls. What can I do on the outside of my home? Regularly fix the edges of walkways and driveways and fix any cracks. Remove anything that might make you trip as you walk through a door, such as a raised step or threshold. Trim any bushes or trees on the path to your home. Use bright outdoor lighting. Clear any walking paths of anything that might make someone trip, such as rocks or tools. Regularly check to see if handrails are loose or broken. Make sure that both sides of any steps have handrails. Any raised decks and porches should have guardrails on the edges. Have any leaves, snow, or ice cleared regularly. Use sand or salt on walking paths during winter. Clean up any spills in your garage right away. This includes oil or grease spills. What can I do in the bathroom? Use night lights. Install grab bars by the toilet and in the tub and shower. Do not use towel bars as grab bars. Use non-skid mats or decals in the tub or shower. If you need to sit down in the shower, use a plastic, non-slip stool. Keep the floor dry. Clean up any water that spills on the floor as soon as it happens. Remove soap buildup in the tub or shower regularly. Attach bath mats securely with double-sided non-slip rug tape. Do not have throw rugs and other things on the floor that can make you trip. What can I do in the bedroom? Use night lights. Make sure that you have a light by your bed that is easy to reach. Do not use any sheets or blankets that are too big for your bed.  They should not hang down onto the floor. Have a firm chair that has side arms. You can use this for support while you get dressed. Do not have throw rugs and other things on the floor that can make you trip. What can I do in the kitchen? Clean up any spills right away. Avoid walking on wet floors. Keep items that you use a lot in easy-to-reach places. If you need to reach something above you, use a strong step stool that has a grab bar. Keep electrical cords out of the way. Do not use floor polish or wax that makes floors slippery. If you must use wax, use non-skid floor wax. Do not have throw rugs and other things on the floor that can make you trip. What can I do with my stairs? Do not leave any items on the stairs. Make sure that there are handrails on both sides of the stairs and use them. Fix handrails that are broken or loose. Make sure that handrails are as long as the stairways. Check any carpeting to make sure that it is firmly attached to the stairs. Fix any carpet that is loose or worn. Avoid having throw rugs at the top or bottom of the stairs. If you do have throw rugs, attach them to the floor with carpet tape. Make sure that you have a light switch  at the top of the stairs and the bottom of the stairs. If you do not have them, ask someone to add them for you. What else can I do to help prevent falls? Wear shoes that: Do not have high heels. Have rubber bottoms. Are comfortable and fit you well. Are closed at the toe. Do not wear sandals. If you use a stepladder: Make sure that it is fully opened. Do not climb a closed stepladder. Make sure that both sides of the stepladder are locked into place. Ask someone to hold it for you, if possible. Clearly mark and make sure that you can see: Any grab bars or handrails. First and last steps. Where the edge of each step is. Use tools that help you move around (mobility aids) if they are needed. These  include: Canes. Walkers. Scooters. Crutches. Turn on the lights when you go into a dark area. Replace any light bulbs as soon as they burn out. Set up your furniture so you have a clear path. Avoid moving your furniture around. If any of your floors are uneven, fix them. If there are any pets around you, be aware of where they are. Review your medicines with your doctor. Some medicines can make you feel dizzy. This can increase your chance of falling. Ask your doctor what other things that you can do to help prevent falls. This information is not intended to replace advice given to you by your health care provider. Make sure you discuss any questions you have with your health care provider. Document Released: 11/10/2008 Document Revised: 06/22/2015 Document Reviewed: 02/18/2014 Elsevier Interactive Patient Education  2017 [employer].

## 2021-11-06 ENCOUNTER — Telehealth (INDEPENDENT_AMBULATORY_CARE_PROVIDER_SITE_OTHER): Payer: Medicare Other | Admitting: Family

## 2021-11-06 ENCOUNTER — Encounter: Payer: Self-pay | Admitting: Family

## 2021-11-06 VITALS — Temp 100.0°F | Ht 71.0 in | Wt 160.0 lb

## 2021-11-06 DIAGNOSIS — R051 Acute cough: Secondary | ICD-10-CM | POA: Diagnosis not present

## 2021-11-06 DIAGNOSIS — U071 COVID-19: Secondary | ICD-10-CM

## 2021-11-06 DIAGNOSIS — Z85828 Personal history of other malignant neoplasm of skin: Secondary | ICD-10-CM | POA: Insufficient documentation

## 2021-11-06 DIAGNOSIS — L814 Other melanin hyperpigmentation: Secondary | ICD-10-CM | POA: Insufficient documentation

## 2021-11-06 DIAGNOSIS — J4520 Mild intermittent asthma, uncomplicated: Secondary | ICD-10-CM | POA: Diagnosis not present

## 2021-11-06 MED ORDER — MOLNUPIRAVIR EUA 200MG CAPSULE: 4.0000 | ORAL_CAPSULE | Freq: Two times a day (BID) | ORAL | 0 refills | Status: AC

## 2021-11-06 NOTE — Progress Notes (Signed)
Virtual Visit via Video   I connected with patient on 11/06/21 at  9:45 AM EDT by a video enabled telemedicine application and verified that I am speaking with the correct person using two identifiers.  Location patient: Home Location provider: [facility] Office Persons participating in the virtual visit: Patient, Provider, CMA   I discussed the limitations of evaluation and management by telemedicine and the availability of in person appointments. The patient expressed understanding and agreed to proceed.  Subjective:   HPI:   66 year old male presents virtually after testing positive for COVID-19 yesterday by home rapid antigen test. His wife tested positive 3 days ago. They both were exposed 6 days ago. Has symptoms of fever, body aches, and cough. Has a history of asthma that is stable but has been known to flare during illness. Therefore, he would like to consider an antiviral therapy.    ROS:   See pertinent positives and negatives per HPI.  Patient Active Problem List   Diagnosis Date Noted   History of skin cancer 11/06/2021   Lentigo 11/06/2021   Asthma 05/13/2018   Allergic rhinitis 05/13/2018   Inguinal hernia, right 07/28/2013   Spermatocele of epididymis, single 07/28/2013    Social History   Tobacco Use   Smoking status: Never   Smokeless tobacco: Never  Substance Use Topics   Alcohol use: Yes    Comment: rare occasion     Current Outpatient Medications:    albuterol (VENTOLIN HFA) 108 (90 Base) MCG/ACT inhaler, INHALE 2 PUFFS INTO THE LUNGS EVERY 4 (FOUR) HOURS AS NEEDED FOR WHEEZING OR SHORTNESS OF BREATH (COUGH, SHORTNESS OF BREATH OR WHEEZING.)., Disp: 18 g, Rfl: 1   Brimonidine Tartrate 0.33 % GEL, Apply 1 drop topically daily., Disp: 1 Tube, Rfl: 2   Fexofenadine HCl (ALLEGRA PO), Take by mouth., Disp: , Rfl:    finasteride (PROSCAR) 5 MG tablet, Take 5 mg by mouth daily., Disp: , Rfl:    fluticasone (FLONASE) 50 MCG/ACT nasal spray,  Place 1-2 sprays into both nostrils daily., Disp: 16 g, Rfl: 1   metroNIDAZOLE (METROGEL) 0.75 % gel, Apply 1 application topically 2 (two) times daily., Disp: 45 g, Rfl: 0   molnupiravir EUA (LAGEVRIO) 200 mg CAPS capsule, Take 4 capsules (800 mg total) by mouth 2 (two) times daily for 5 days., Disp: 40 capsule, Rfl: 0   PULMICORT FLEXHALER 180 MCG/ACT inhaler, TAKE 2 PUFFS BY MOUTH TWICE A DAY, Disp: 1 each, Rfl: 5   tamsulosin (FLOMAX) 0.4 MG CAPS capsule, Take 0.8 mg by mouth in the morning and at bedtime., Disp: , Rfl:    budesonide (PULMICORT) 0.25 MG/2ML nebulizer solution, Pulmicort, Disp: , Rfl:   No Known Allergies  Objective:   Temp 100 F (37.8 C)   Ht '5\' 11"'$  (1.803 m)   Wt 160 lb (72.6 kg)   BMI 22.32 kg/m   Patient is well-developed, well-nourished in no acute distress.  Resting comfortably at home.  Head is normocephalic, atraumatic.  No labored breathing.  Speech is clear and coherent with logical content.  Patient is alert and oriented at baseline.    Assessment and Plan:   Lamir was seen today for covid positive.  Diagnoses and all orders for this visit:  COVID-19  Acute cough  Mild intermittent asthma without complication  Other orders -     molnupiravir EUA (LAGEVRIO) 200 mg CAPS capsule; Take 4 capsules (800 mg total) by mouth 2 (two) times daily for 5 days.  No current kidney function on file, hence Paxlovid not selected. Medication double checked for any interaction, none found. Isolation precautions provide per CDC recommendations. Call if symptoms worsen or persist. Recheck as scheduled and as needed.         Kennyth Arnold, FNP 11/06/2021  Time spent with the patient: 20 minutes, of which >50% was spent in obtaining information about symptoms, reviewing previous labs, evaluations, and treatments, counseling about condition (please see the discussed topics above), and developing a plan to further investigate it; had a number of questions  which I addressed.

## 2021-12-31 DIAGNOSIS — R3912 Poor urinary stream: Secondary | ICD-10-CM | POA: Diagnosis not present

## 2021-12-31 DIAGNOSIS — N403 Nodular prostate with lower urinary tract symptoms: Secondary | ICD-10-CM | POA: Diagnosis not present

## 2021-12-31 DIAGNOSIS — R351 Nocturia: Secondary | ICD-10-CM | POA: Diagnosis not present

## 2021-12-31 DIAGNOSIS — N5201 Erectile dysfunction due to arterial insufficiency: Secondary | ICD-10-CM | POA: Diagnosis not present

## 2022-01-02 ENCOUNTER — Other Ambulatory Visit: Payer: Self-pay | Admitting: Urology

## 2022-01-15 NOTE — Patient Instructions (Signed)
DUE TO COVID-19 ONLY TWO VISITORS  (aged 67 and older)  ARE ALLOWED TO COME WITH YOU AND STAY IN THE WAITING ROOM ONLY DURING PRE OP AND PROCEDURE.   **NO VISITORS ARE ALLOWED IN THE SHORT STAY AREA OR RECOVERY ROOM!!**  IF YOU WILL BE ADMITTED INTO THE HOSPITAL YOU ARE ALLOWED ONLY FOUR SUPPORT PEOPLE DURING VISITATION HOURS ONLY (7 AM -8PM)   The support person(s) must pass our screening, gel in and out, and wear a mask at all times, including in the patient's room. Patients must also wear a mask when staff or their support person are in the room. Visitors GUEST BADGE MUST BE WORN VISIBLY  One adult visitor may remain with you overnight and MUST be in the room by 8 P.M.     Your procedure is scheduled on: 01/24/22   Report to [facility] Main Entrance    Report to admitting at : 5:15 AM   Call this number if you have problems the morning of surgery [phone]   Do not eat food :After Midnight.  FOLLOW BOWEL PREP AND ANY ADDITIONAL PRE OP INSTRUCTIONS YOU RECEIVED FROM YOUR SURGEON'S OFFICE!!!   Oral Hygiene is also important to reduce your risk of infection.                                    Remember - BRUSH YOUR TEETH THE MORNING OF SURGERY WITH YOUR REGULAR TOOTHPASTE  DENTURES WILL BE REMOVED PRIOR TO SURGERY PLEASE DO NOT APPLY "Poly grip" OR ADHESIVES!!!   Do NOT smoke after Midnight   Take these medicines the morning of surgery with A SIP OF WATER: Allegra as needed.Use inhalers as usual.  Bring CPAP mask and tubing day of surgery.                              You may not have any metal on your body including hair pins, jewelry, and body piercing             Do not wear lotions, powders, perfumes/cologne, or deodorant              Men may shave face and neck.   Do not bring valuables to the hospital. [place].   Contacts, glasses, or bridgework may not be worn into surgery.   Bring small overnight bag  day of surgery.   DO NOT [place]. PHARMACY WILL DISPENSE MEDICATIONS LISTED ON YOUR MEDICATION LIST TO YOU DURING YOUR ADMISSION [place]!    Patients discharged on the day of surgery will not be allowed to drive home.  Someone NEEDS to stay with you for the first 24 hours after anesthesia.   Special Instructions: Bring a copy of your healthcare power of attorney and living will documents         the day of surgery if you haven't scanned them before.              Please read over the following fact sheets you were given: IF YOU HAVE QUESTIONS ABOUT YOUR PRE-OP INSTRUCTIONS PLEASE CALL [phone]    [facility] - Preparing for Surgery Before surgery, you can play an important role.  Because skin is not sterile,  your skin needs to be as free of germs as possible.  You can reduce the number of germs on your skin by washing with CHG (chlorahexidine gluconate) soap before surgery.  CHG is an antiseptic cleaner which kills germs and bonds with the skin to continue killing germs even after washing. Please DO NOT use if you have an allergy to CHG or antibacterial soaps.  If your skin becomes reddened/irritated stop using the CHG and inform your nurse when you arrive at Short Stay. Do not shave (including legs and underarms) for at least 48 hours prior to the first CHG shower.  You may shave your face/neck. Please follow these instructions carefully:  1.  Shower with CHG Soap the night before surgery and the  morning of Surgery.  2.  If you choose to wash your hair, wash your hair first as usual with your  normal  shampoo.  3.  After you shampoo, rinse your hair and body thoroughly to remove the  shampoo.                           4.  Use CHG as you would any other liquid soap.  You can apply chg directly  to the skin and wash                       Gently with a scrungie or clean washcloth.  5.  Apply the CHG Soap to your body ONLY FROM THE NECK DOWN.   Do not  use on face/ open                           Wound or open sores. Avoid contact with eyes, ears mouth and genitals (private parts).                       Wash face,  Genitals (private parts) with your normal soap.             6.  Wash thoroughly, paying special attention to the area where your surgery  will be performed.  7.  Thoroughly rinse your body with warm water from the neck down.  8.  DO NOT shower/wash with your normal soap after using and rinsing off  the CHG Soap.                9.  Pat yourself dry with a clean towel.            10.  Wear clean pajamas.            11.  Place clean sheets on your bed the night of your first shower and do not  sleep with pets. Day of Surgery : Do not apply any lotions/deodorants the morning of surgery.  Please wear clean clothes to the hospital/surgery center.  FAILURE TO FOLLOW THESE INSTRUCTIONS MAY RESULT IN THE CANCELLATION OF YOUR SURGERY PATIENT SIGNATURE_________________________________  NURSE SIGNATURE__________________________________  ________________________________________________________________________

## 2022-01-16 ENCOUNTER — Encounter (HOSPITAL_COMMUNITY)
Admission: RE | Admit: 2022-01-16 | Discharge: 2022-01-16 | Disposition: A | Discharge status: Home / Self Care | Payer: Medicare Other | Source: Ambulatory Visit | Attending: Urology | Admitting: Urology

## 2022-01-16 ENCOUNTER — Other Ambulatory Visit: Payer: Self-pay

## 2022-01-16 ENCOUNTER — Encounter (HOSPITAL_COMMUNITY): Payer: Self-pay

## 2022-01-16 VITALS — BP 156/73 | HR 89 | Temp 97.7°F | Ht 70.5 in | Wt 157.0 lb

## 2022-01-16 DIAGNOSIS — Z01812 Encounter for preprocedural laboratory examination: Secondary | ICD-10-CM | POA: Insufficient documentation

## 2022-01-16 DIAGNOSIS — R7989 Other specified abnormal findings of blood chemistry: Secondary | ICD-10-CM | POA: Diagnosis not present

## 2022-01-16 DIAGNOSIS — Z01818 Encounter for other preprocedural examination: Secondary | ICD-10-CM

## 2022-01-16 LAB — CBC
HCT: 46.6 % (ref 39.0–52.0)
Hemoglobin: 16.5 g/dL (ref 13.0–17.0)
MCH: 33.5 pg (ref 26.0–34.0)
MCHC: 35.4 g/dL (ref 30.0–36.0)
MCV: 94.7 fL (ref 80.0–100.0)
Platelets: 143 10*3/uL — ABNORMAL LOW (ref 150–400)
RBC: 4.92 MIL/uL (ref 4.22–5.81)
RDW: 12.4 % (ref 11.5–15.5)
WBC: 3.7 10*3/uL — ABNORMAL LOW (ref 4.0–10.5)
nRBC: 0 % (ref 0.0–0.2)

## 2022-01-16 NOTE — Progress Notes (Signed)
For Short Stay: [place] appointment date:  Bowel Prep reminder:   For Anesthesia: PCP - Dr. [name] Cardiologist -   Chest x-ray -  EKG -  Stress Test -  ECHO -  Cardiac Cath -  Pacemaker/ICD device last checked: Pacemaker orders received: Device Rep notified:  Spinal Cord Stimulator:  Sleep Study -  CPAP -   Fasting Blood Sugar - N/A Checks Blood Sugar ___0__ times a day Date and result of last Hgb A1c- 5.3: 06/05/21  Last dose of GLP1 agonist-  GLP1 instructions:   Last dose of SGLT-2 inhibitors-  SGLT-2 instructions:   Blood Thinner Instructions: Aspirin Instructions: Last Dose:  Activity level: Can go up a flight of stairs and activities of daily living without stopping and without chest pain and/or shortness of breath   Able to exercise without chest pain and/or shortness of breath   Unable to go up a flight of stairs without chest pain and/or shortness of breath     Anesthesia review:   Patient denies shortness of breath, fever, cough and chest pain at PAT appointment   Patient verbalized understanding of instructions that were given to them at the PAT appointment. Patient was also instructed that they will need to review over the PAT instructions again at home before surgery.

## 2022-01-23 NOTE — H&P (Signed)
Frequency, Nocturia and Urgency     12/31/21: Shaun Lee returns today in f/u. He remains on finasteride and tamsulosin 0.'8mg'$  daily. His IPSS is stable at 19. He has nocturia x 3. He has some hesitancy at night. His UA is clear.   06/27/21: Shaun Lee returns today in f/u. He remains on finasteride and tamsulosin 0.'8mg'$ . His IPSS is stable at 20 with noctuira x 3 and a reduced stream with some straining and frequency. His PSA is stable at 4.52 with a 12% f/t ratio. His prostate was 42m at the time of his biopsy in 1/21. He had an MRI in 2022 that showed the volume was down to 533m He has ED and tried tadalafil but had tinnitus and stopped it.   12/13/20: Shaun Lee for the history noted below. He remains on finasteride and tamsulosin 0.'8mg'$ . He tried silodosin but it was no better so he went back to the tamsulosin. His PSA is 4.84 which is minimally increased. He has an IPSS of 20. He has nocturia x 3 and more trouble at night with the voiding. He had an MRIP on 06/29/20 that showed PIRADs 1 and 2 lesions but nothing worrisome. He had side effects with tadalafil but is doing ok without it. He still has tinnitus.   06/07/20: Shaun Lee today in f/u for his history of BPH with BOO and has now been on finasteride for 12 months and remains on tamsulosin 0.'8mg'$  daily and daily tadalafil. He recently woke with ringing in his ears so he stopped it. He is scheduled to see ENT. It almost resolved and then recurred. It is not bothersome. He didn't notice that the tadalafil was helping the BPH symptoms. He was getting some benefit from the ED. He is doing better with the combination therapy and his IPSS is 18 which is up slightly from 14 at his last visit but remains down from from 27 prior to treatment. He still has nocturia 2-3x. He has some hesitancy. His PSA has fallen to 4.47 from 9.63 prior to therapy. He has mild nasal stuffiness but no other side effects of therapy. A PVR is 2762moday and the urine is  unremarkable.   03/08/19: He had a prostate biopsy for a PSA of 9.63 on 1/27. The biopsy was negative but the prostate volume was 59m98me did a flow rate today with a reduced stream with a PF of 5ml/9m with a very prolonged flow. His PVR is 246ml.97mU Hx: Shaun Lee yo 27 who was last seen by Dr. TannenGaynelle Arabian12 for a spermatocele. He sent back in consultation by RicharMaximiano Lee an elevated PSA and increased voiding symptoms. He has a history of an elevated PSA that was 6.7 in 2017 and 6.4 on 07/10/18. [facility]. His PSA was 5.39 with the last check by Dr. Eskue.Glean Hesseports nocturia x 3-4. He can have some hesitancy and intermittency at night. He has daytime frequency with intermittency and a reduced stream. He can have some urgency with minimal UUI. He has had no hematuria or dysuria. He generally feels he empties. He has not had treatment for his LUTS. He has had no stones, UTI's or GU surgery.    CC: AUA Questions Scoring.  HPI: Shaun Lowrimore66 [id number] male established patient who is here for evaluation.      AUA Symptom Score: Less than 50% of  the time he has the sensation of not emptying his bladder completely when finished urinating. 50% of the time he has to urinate again fewer than two hours after he has finished urinating. 50% of the time he has to start and stop again several times when he urinates. Less than 50% of the time he finds it difficult to postpone urination. More than 50% of the time he has a weak urinary stream. Less than 50% of the time he has to push or strain to begin urination. He has to get up to urinate 3 times from the time he goes to bed until the time he gets up in the morning.   Calculated AUA Symptom Score: 19    ALLERGIES: No Allergies    MEDICATIONS: Finasteride 5 mg tablet 1 tablet PO Daily  Tamsulosin Hcl  Albuterol AERS Inhalation  Allegra Allergy 180 MG Oral Tablet Oral  Ibuprofen CAPS  Oral  Pulmicort Flexhaler 180 MCG/ACT INHA Inhalation  Sildenafil Citrate 100 mg tablet 1/2 - 1 tablet po prn qday an hour before sexual activity.     GU PSH: Complex Uroflow - 2021 Cystoscopy - 2021 Prostate Needle Biopsy - 2021       PSH Notes: Hernia Repair   NON-GU PSH: Hernia Repair - 2012 Surgical Pathology, Gross And Microscopic Examination For Prostate Needle - 2021     GU PMH: ED due to arterial insufficiency, He would like to try sildenafil and with start with 1/2 '100mg'$  tab and work up if needed. Side effects and instructions discussed. - 06/27/2021, He is doing ok on no therapy at this time. He still has tinnitus despite stopping the tadalafil. , - 12/13/2020, He had some ringing in the ears and stopped the tadalafil. He will be seeing ENT. , - 2022, Continue tadalafil. , - 2021, He has some mild issues with ED and the daily tadalafil might help that. , - 2021 Elevated PSA, His PSA is stable since starting finasteride. - 06/27/2021, His PSA is rising slowly on finasteride. The MRI showed no high risk lesions. If the PSA continues to rise, we will need to consider a repeat biopsy regardless of the MRI. If he elects a TURP, I would do a biopsy as well. , - 12/13/2020, His PSA is up slightly on finasteride with a low f/t ratio. with the right apical induration and the rise, I will get him set up for an MRI of the prostate and if positive consider a repeat biopsy. , - 2022, Declining appropriately on finasteride. , - 2021, - 2021, - 2020 Nocturia - 06/27/2021, - 12/13/2020, The tamsulosin isn't helping the nocturia. I will try him on silodosin in addition to the finasteride. Instructions given. , - 2022, - 2021, - 2020 Prostate nodule w/ LUTS, He has stable moderate LUTS on maximal medical therapy. I discussed options including TURP, Urolift, Rezum and PAE. He remains interested in a TURP but wants to think about it more. I will tentatively have him return in 6 months. - 06/27/2021, He has  stable moderate LUTS on finasteride and tamsulosin. he has ejaculatory dysfunction so I discussed a possible change to alfuzosin which he will think about. I also discussed Rezum, TURP and well as other options for BPH management. He is concerned about the impact on ejaculation. He will continue current therapy but let me know if he changes his mind and will f/u in 6 months with a PVR. , - 12/13/2020, - 2022, He has stable moderate LUT on current  therapy. I have refilled the tamsulosin with his mail order pharmacy. He had some questions about the TURP. , - 2021, He has a chronically elevated PSA with an enlarged prostate with right apical induration and severe LUTS. I am going to repeat a PSA today and get him set up for a prostate [place] and biopsy. I have reviewed the risks of bleeding, infection and voiding difficulty and sent levaquin for the prep. I will also get him started on tamsulosin for the LUTS but will do further w/u for that once we have done the biopsy. I reviewed the side effects of the tamsulosin. , - 2020 Weak Urinary Stream - 06/27/2021, - 12/13/2020, - 2022, - 2021, - 2021 Incomplete bladder emptying, His PVR is down to 26m. - 2022, PVR was 2423m , - 2021 Spermatocele of epididymis, Unspec, Spermatocele - 2014      PMH Notes:  2010-12-28 14:43:11 - Note: Epididymis Mass (___cm)  2010-12-28 14:13:42 - Note: Skin Cancer   NON-GU PMH: Asthma, Asthma - 2014 Encounter for general adult medical examination without abnormal findings, Encounter for preventive health examination Hypercholesterolemia Hypertension    FAMILY HISTORY: Diabetes - Mother Family Health Status Number - Runs In Family Father Deceased At AgAge41__ - Runs In Family Mother Deceased At Age 445rom diabetic complicati - Runs In Family renal failure - Mother   SOCIAL HISTORY: Marital Status: Married Preferred Language: English; Race: White Current Smoking Status: Patient has never smoked.   Tobacco Use Assessment  Completed: Used Tobacco in last 30 days? Drinks 2 drinks per week.  Drinks 2 caffeinated drinks per day. 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 Patient denies hard to postpone urination, burning/ pain with urination, have to strain to urinate , erection problems, and penile pain.  Gastrointestinal (Upper):   Patient denies nausea, vomiting, and indigestion/ heartburn.  Gastrointestinal (Lower):   Patient denies constipation and diarrhea.  Constitutional:   Patient denies fever, night sweats, weight loss, and fatigue.  Skin:   Patient denies skin rash/ lesion and itching.  Eyes:   Patient denies blurred vision and double vision.  Ears/ Nose/ Throat:   Patient denies sore throat and sinus problems.  Hematologic/Lymphatic:   Patient denies swollen glands and easy bruising.  Cardiovascular:   Patient denies leg swelling and chest pains.  Respiratory:   Patient denies cough and shortness of breath.  Endocrine:   Patient denies excessive thirst.  Musculoskeletal:   Patient denies back pain and joint pain.  Neurological:   Patient denies headaches and dizziness.  Psychologic:   Patient denies depression and anxiety.   Notes: Urgency, weak stream    VITAL SIGNS: None   MULTI-SYSTEM PHYSICAL EXAMINATION:    Constitutional: Well-nourished. No physical deformities. Normally developed. Good grooming.  Respiratory: Normal breath sounds. No labored breathing, no use of accessory muscles.   Cardiovascular: Regular rate and rhythm. No murmur, no gallop.      Complexity of Data:  Records Review:   AUA Symptom Score, Previous Patient Records  Urine Test Review:   Urinalysis   06/20/21 12/06/20 05/31/20 12/01/19 09/13/19 01/05/19  PSA  Total PSA 4.52 ng/mL 4.84 ng/mL 4.70  ng/mL 4.47 ng/mL 7.57 ng/mL 9.63 ng/mL  Free PSA 0.55 ng/mL 0.51  ng/mL 0.53 ng/mL 0.51 ng/mL 0.82 ng/mL 1.63 ng/mL  % Free PSA 12 % PSA 11 % PSA 11 % PSA 11 % PSA 11 % PSA 17 % PSA    PROCEDURES:          Urinalysis Dipstick Dipstick Cont'd  Color: Yellow Bilirubin: Neg mg/dL  Appearance: Clear Ketones: Neg mg/dL  Specific Gravity: 1.025 Blood: Neg ery/uL  pH: <=5.0 Protein: Neg mg/dL  Glucose: Neg mg/dL Urobilinogen: 0.2 mg/dL    Nitrites: Neg    Leukocyte Esterase: Neg leu/uL    ASSESSMENT:      ICD-10 Details  1 GU:   Prostate nodule w/ LUTS - N40.3 Chronic, Stable - He has stable LUTS on tamsulosin and finasteride but is interested in surgical management. I discussed options and will get him scheduled for a TURP.   I reviewd the risks of a TURP including bleeding, infection, incontinence, stricture, need for secondary procedures, ejaculatory and erectile dysfunction, thrombotic events, fluid overload and anesthetic complications. I explained that 95% of men will have relief of the obstructive symptoms and about 70% will have relief of the irritative symptoms.    Tamsulosin refilled for the time being. Finasteride script is current.   2   Weak Urinary Stream - R39.12 Chronic, Stable  3   Nocturia - R35.1 Chronic, Stable  4   ED due to arterial insufficiency - N52.01 Chronic, Stable - He remains on sildenafil.    PLAN:            Medications New Meds: Tamsulosin Hcl 0.4 mg capsule 2 capsule PO Daily   #180  3 Refill(s)  Pharmacy Name:  [facility]  Address:  [place], [place] [id number]  Phone:  [phone]  Fax:  [phone]            Schedule Return Visit/Planned Activity: Next Available Appointment - Schedule Surgery  Procedure: Unspecified Date - Cystoscopy TURP - 39767 Notes: Next available.           Document Letter(s):  Created for Patient: Clinical Summary

## 2022-01-24 ENCOUNTER — Other Ambulatory Visit: Payer: Self-pay

## 2022-01-24 ENCOUNTER — Observation Stay (HOSPITAL_COMMUNITY)
Admission: RE | Admit: 2022-01-24 | Discharge: 2022-01-25 | Disposition: A | Discharge status: Home / Self Care | Payer: Medicare Other | Attending: Urology | Admitting: Urology

## 2022-01-24 ENCOUNTER — Encounter (HOSPITAL_COMMUNITY)
Admission: RE | Disposition: A | Discharge status: Home / Self Care | Payer: Self-pay | Source: Home / Self Care | Attending: Urology

## 2022-01-24 ENCOUNTER — Ambulatory Visit (HOSPITAL_BASED_OUTPATIENT_CLINIC_OR_DEPARTMENT_OTHER): Payer: Medicare Other | Admitting: Certified Registered Nurse Anesthetist

## 2022-01-24 ENCOUNTER — Encounter (HOSPITAL_COMMUNITY): Payer: Self-pay | Admitting: Urology

## 2022-01-24 ENCOUNTER — Ambulatory Visit (HOSPITAL_COMMUNITY): Payer: Medicare Other | Admitting: Certified Registered Nurse Anesthetist

## 2022-01-24 DIAGNOSIS — J45909 Unspecified asthma, uncomplicated: Secondary | ICD-10-CM | POA: Diagnosis not present

## 2022-01-24 DIAGNOSIS — Z79899 Other long term (current) drug therapy: Secondary | ICD-10-CM | POA: Insufficient documentation

## 2022-01-24 DIAGNOSIS — N410 Acute prostatitis: Secondary | ICD-10-CM | POA: Diagnosis not present

## 2022-01-24 DIAGNOSIS — N401 Enlarged prostate with lower urinary tract symptoms: Secondary | ICD-10-CM | POA: Diagnosis not present

## 2022-01-24 DIAGNOSIS — N403 Nodular prostate with lower urinary tract symptoms: Secondary | ICD-10-CM | POA: Insufficient documentation

## 2022-01-24 DIAGNOSIS — N138 Other obstructive and reflux uropathy: Secondary | ICD-10-CM | POA: Diagnosis present

## 2022-01-24 DIAGNOSIS — N400 Enlarged prostate without lower urinary tract symptoms: Secondary | ICD-10-CM | POA: Diagnosis not present

## 2022-01-24 DIAGNOSIS — Z85828 Personal history of other malignant neoplasm of skin: Secondary | ICD-10-CM | POA: Diagnosis not present

## 2022-01-24 DIAGNOSIS — N411 Chronic prostatitis: Secondary | ICD-10-CM | POA: Insufficient documentation

## 2022-01-24 DIAGNOSIS — N320 Bladder-neck obstruction: Secondary | ICD-10-CM | POA: Diagnosis not present

## 2022-01-24 HISTORY — PX: TRANSURETHRAL RESECTION OF PROSTATE: SHX73

## 2022-01-24 LAB — HIV ANTIBODY (ROUTINE TESTING W REFLEX): HIV Screen 4th Generation wRfx: NONREACTIVE

## 2022-01-24 SURGERY — TURP (TRANSURETHRAL RESECTION OF PROSTATE)
Anesthesia: General | Site: Penis

## 2022-01-24 MED ORDER — ACETAMINOPHEN 325 MG PO TABS: 650.0000 mg | ORAL_TABLET | ORAL | Status: DC | PRN

## 2022-01-24 MED ORDER — DIPHENHYDRAMINE HCL 50 MG/ML IJ SOLN: 12.5000 mg | Freq: Four times a day (QID) | INTRAMUSCULAR | Status: DC | PRN

## 2022-01-24 MED ORDER — FENTANYL CITRATE (PF) 100 MCG/2ML IJ SOLN
INTRAMUSCULAR | Status: AC
  Filled 2022-01-24: qty 2

## 2022-01-24 MED ORDER — BISACODYL 10 MG RE SUPP: 10.0000 mg | Freq: Every day | RECTAL | Status: DC | PRN

## 2022-01-24 MED ORDER — SODIUM CHLORIDE 0.9 % IR SOLN
Status: DC | PRN
  Administered 2022-01-24 (×3): 6000 mL

## 2022-01-24 MED ORDER — AMISULPRIDE (ANTIEMETIC) 5 MG/2ML IV SOLN: 10.0000 mg | Freq: Once | INTRAVENOUS | Status: DC | PRN

## 2022-01-24 MED ORDER — OXYCODONE HCL 5 MG PO TABS: 5.0000 mg | ORAL_TABLET | Freq: Once | ORAL | Status: DC | PRN

## 2022-01-24 MED ORDER — ALBUTEROL SULFATE (2.5 MG/3ML) 0.083% IN NEBU: 3.0000 mL | INHALATION_SOLUTION | RESPIRATORY_TRACT | Status: DC | PRN

## 2022-01-24 MED ORDER — DIPHENHYDRAMINE HCL 12.5 MG/5ML PO ELIX
12.5000 mg | ORAL_SOLUTION | Freq: Four times a day (QID) | ORAL | Status: DC | PRN
  Filled 2022-01-24: qty 5

## 2022-01-24 MED ORDER — FLEET ENEMA 7-19 GM/118ML RE ENEM: 1.0000 | ENEMA | Freq: Once | RECTAL | Status: DC | PRN

## 2022-01-24 MED ORDER — SODIUM CHLORIDE 0.9 % IR SOLN: 3000.0000 mL | Status: DC

## 2022-01-24 MED ORDER — LACTATED RINGERS IV SOLN: INTRAVENOUS | Status: DC

## 2022-01-24 MED ORDER — ONDANSETRON HCL 4 MG/2ML IJ SOLN
INTRAMUSCULAR | Status: AC
  Filled 2022-01-24: qty 2

## 2022-01-24 MED ORDER — OXYCODONE HCL 5 MG PO TABS: 5.0000 mg | ORAL_TABLET | ORAL | Status: DC | PRN

## 2022-01-24 MED ORDER — TRIAMCINOLONE ACETONIDE 55 MCG/ACT NA AERO: 2.0000 | INHALATION_SPRAY | Freq: Every day | NASAL | Status: DC | PRN

## 2022-01-24 MED ORDER — SENNOSIDES-DOCUSATE SODIUM 8.6-50 MG PO TABS: 1.0000 | ORAL_TABLET | Freq: Every evening | ORAL | Status: DC | PRN

## 2022-01-24 MED ORDER — ONDANSETRON HCL 4 MG/2ML IJ SOLN: 4.0000 mg | INTRAMUSCULAR | Status: DC | PRN

## 2022-01-24 MED ORDER — ORAL CARE MOUTH RINSE: 15.0000 mL | Freq: Once | OROMUCOSAL | Status: AC

## 2022-01-24 MED ORDER — CEFAZOLIN SODIUM-DEXTROSE 2-4 GM/100ML-% IV SOLN
2.0000 g | INTRAVENOUS | Status: AC
  Administered 2022-01-24: 2 g via INTRAVENOUS
  Filled 2022-01-24: qty 100

## 2022-01-24 MED ORDER — MEPERIDINE HCL 50 MG/ML IJ SOLN: 6.2500 mg | INTRAMUSCULAR | Status: DC | PRN

## 2022-01-24 MED ORDER — ONDANSETRON HCL 4 MG/2ML IJ SOLN
INTRAMUSCULAR | Status: DC | PRN
  Administered 2022-01-24: 4 mg via INTRAVENOUS

## 2022-01-24 MED ORDER — ACETAMINOPHEN 10 MG/ML IV SOLN
INTRAVENOUS | Status: DC | PRN
  Administered 2022-01-24: 1000 mg via INTRAVENOUS

## 2022-01-24 MED ORDER — PROPOFOL 10 MG/ML IV BOLUS
INTRAVENOUS | Status: DC | PRN
  Administered 2022-01-24: 200 mg via INTRAVENOUS

## 2022-01-24 MED ORDER — LORATADINE 10 MG PO TABS
10.0000 mg | ORAL_TABLET | Freq: Every day | ORAL | Status: DC
  Filled 2022-01-24 (×2): qty 1

## 2022-01-24 MED ORDER — LIDOCAINE HCL (PF) 2 % IJ SOLN
INTRAMUSCULAR | Status: AC
  Filled 2022-01-24: qty 5

## 2022-01-24 MED ORDER — MIDAZOLAM HCL 2 MG/2ML IJ SOLN
INTRAMUSCULAR | Status: AC
  Filled 2022-01-24: qty 2

## 2022-01-24 MED ORDER — HYDROMORPHONE HCL 1 MG/ML IJ SOLN: 0.5000 mg | INTRAMUSCULAR | Status: DC | PRN

## 2022-01-24 MED ORDER — HYOSCYAMINE SULFATE 0.125 MG SL SUBL: 0.1250 mg | SUBLINGUAL_TABLET | SUBLINGUAL | Status: DC | PRN

## 2022-01-24 MED ORDER — FENTANYL CITRATE (PF) 100 MCG/2ML IJ SOLN
INTRAMUSCULAR | Status: DC | PRN
  Administered 2022-01-24: 50 ug via INTRAVENOUS

## 2022-01-24 MED ORDER — HYDROMORPHONE HCL 1 MG/ML IJ SOLN: 0.2500 mg | INTRAMUSCULAR | Status: DC | PRN

## 2022-01-24 MED ORDER — FINASTERIDE 5 MG PO TABS
5.0000 mg | ORAL_TABLET | Freq: Every day | ORAL | Status: DC
  Administered 2022-01-24 – 2022-01-25 (×2): 5 mg via ORAL
  Filled 2022-01-24 (×2): qty 1

## 2022-01-24 MED ORDER — PROMETHAZINE HCL 25 MG/ML IJ SOLN: 6.2500 mg | INTRAMUSCULAR | Status: DC | PRN

## 2022-01-24 MED ORDER — CHLORHEXIDINE GLUCONATE 0.12 % MT SOLN
15.0000 mL | Freq: Once | OROMUCOSAL | Status: AC
  Administered 2022-01-24: 15 mL via OROMUCOSAL

## 2022-01-24 MED ORDER — ACETAMINOPHEN 10 MG/ML IV SOLN
INTRAVENOUS | Status: AC
  Filled 2022-01-24: qty 100

## 2022-01-24 MED ORDER — MIDAZOLAM HCL 5 MG/5ML IJ SOLN
INTRAMUSCULAR | Status: DC | PRN
  Administered 2022-01-24: 2 mg via INTRAVENOUS

## 2022-01-24 MED ORDER — POTASSIUM CHLORIDE IN NACL 20-0.45 MEQ/L-% IV SOLN
INTRAVENOUS | Status: DC
  Filled 2022-01-24 (×3): qty 1000

## 2022-01-24 MED ORDER — LIDOCAINE 2% (20 MG/ML) 5 ML SYRINGE
INTRAMUSCULAR | Status: DC | PRN
  Administered 2022-01-24: 60 mg via INTRAVENOUS

## 2022-01-24 MED ORDER — DEXAMETHASONE SODIUM PHOSPHATE 10 MG/ML IJ SOLN
INTRAMUSCULAR | Status: AC
  Filled 2022-01-24: qty 1

## 2022-01-24 MED ORDER — PROPOFOL 10 MG/ML IV BOLUS
INTRAVENOUS | Status: AC
  Filled 2022-01-24: qty 20

## 2022-01-24 MED ORDER — DEXAMETHASONE SODIUM PHOSPHATE 10 MG/ML IJ SOLN
INTRAMUSCULAR | Status: DC | PRN
  Administered 2022-01-24: 5 mg via INTRAVENOUS

## 2022-01-24 MED ORDER — OXYCODONE HCL 5 MG/5ML PO SOLN: 5.0000 mg | Freq: Once | ORAL | Status: DC | PRN

## 2022-01-24 MED ORDER — PHENYLEPHRINE 80 MCG/ML (10ML) SYRINGE FOR IV PUSH (FOR BLOOD PRESSURE SUPPORT)
PREFILLED_SYRINGE | INTRAVENOUS | Status: DC | PRN
  Administered 2022-01-24: 40 ug via INTRAVENOUS

## 2022-01-24 SURGICAL SUPPLY — 19 items
BAG DRN RND TRDRP ANRFLXCHMBR (UROLOGICAL SUPPLIES) ×1
BAG URINE DRAIN 2000ML AR STRL (UROLOGICAL SUPPLIES) IMPLANT
BAG URO CATCHER STRL LF (MISCELLANEOUS) ×2 IMPLANT
CATH FOLEY 3WAY 30CC 22FR (CATHETERS) IMPLANT
DRAPE FOOT SWITCH (DRAPES) ×2 IMPLANT
ELECT REM PT RETURN 15FT ADLT (MISCELLANEOUS) ×2 IMPLANT
GLOVE SURG SS PI 8.0 STRL IVOR (GLOVE) IMPLANT
GOWN STRL REUS W/ TWL XL LVL3 (GOWN DISPOSABLE) ×2 IMPLANT
GOWN STRL REUS W/TWL XL LVL3 (GOWN DISPOSABLE) ×1
HOLDER FOLEY CATH W/STRAP (MISCELLANEOUS) IMPLANT
KIT TURNOVER KIT A (KITS) IMPLANT
LOOP CUT BIPOLAR 24F LRG (ELECTROSURGICAL) IMPLANT
MANIFOLD NEPTUNE II (INSTRUMENTS) ×2 IMPLANT
PACK CYSTO (CUSTOM PROCEDURE TRAY) ×2 IMPLANT
SYR 30ML LL (SYRINGE) IMPLANT
SYR TOOMEY IRRIG 70ML (MISCELLANEOUS) ×1
SYRINGE TOOMEY IRRIG 70ML (MISCELLANEOUS) IMPLANT
TUBING CONNECTING 10 (TUBING) ×2 IMPLANT
TUBING UROLOGY SET (TUBING) ×2 IMPLANT

## 2022-01-24 NOTE — Anesthesia Procedure Notes (Signed)
Procedure Name: Intubation Date/Time: 01/24/2022 7:36 AM  Performed by: West Pugh, CRNAPre-anesthesia Checklist: Patient identified, Emergency Drugs available, Suction available, Patient being monitored and Timeout performed Patient Re-evaluated:Patient Re-evaluated prior to induction Oxygen Delivery Method: Circle system utilized Preoxygenation: Pre-oxygenation with 100% oxygen Induction Type: IV induction LMA: LMA with gastric port inserted LMA Size: 4.0 Laryngoscope Size: Mac and 3 Tube type: Oral Number of attempts: 1 Airway Equipment and Method: Stylet Placement Confirmation: ETT inserted through vocal cords under direct vision, positive ETCO2, CO2 detector and breath sounds checked- equal and bilateral Tube secured with: Tape Dental Injury: Teeth and Oropharynx as per pre-operative assessment

## 2022-01-24 NOTE — Transfer of Care (Signed)
Immediate Anesthesia Transfer of Care Note  Patient: Shaun Lee  Procedure(s) Performed: TRANSURETHRAL RESECTION OF THE PROSTATE (TURP) (Penis)  Patient Location: PACU  Anesthesia Type:General  Level of Consciousness: drowsy  Airway & Oxygen Therapy: Patient Spontanous Breathing and Patient connected to face mask oxygen  Post-op Assessment: Report given to RN and Post -op Vital signs reviewed and stable  Post vital signs: Reviewed and stable  Last Vitals:  Vitals Value Taken Time  BP 130/72 01/24/22 0848  Temp    Pulse 67 01/24/22 0850  Resp 15 01/24/22 0850  SpO2 100 % 01/24/22 0850  Vitals shown include unvalidated device data.  Last Pain:  Vitals:   01/24/22 0553  TempSrc:   PainSc: 0-No pain         Complications: No notable events documented.

## 2022-01-24 NOTE — Interval H&P Note (Signed)
History and Physical Interval Note:  01/24/2022 7:20 AM  Shaun Lee  has presented today for surgery, with the diagnosis of [place].  The various methods of treatment have been discussed with the patient and family. After consideration of risks, benefits and other options for treatment, the patient has consented to  Procedure(s): TRANSURETHRAL RESECTION OF THE PROSTATE (TURP) (N/A) as a surgical intervention.  The patient's history has been reviewed, patient examined, no change in status, stable for surgery.  I have reviewed the patient's chart and labs.  Questions were answered to the patient's satisfaction.     Irine Seal

## 2022-01-24 NOTE — Op Note (Signed)
Procedure: Transurethral resection of the prostate.  Preop diagnosis: BPH with bladder outlet obstruction.  Postop diagnosis: Same.  Surgeon: Dr. Irine Seal.  Anesthesia: General.  Specimen: Prostate chips.  Drain: 85 [place] three-way Foley catheter.  EBL: 200 mL.  Complications: None.  Indications: The patient is a 66 year old male with a history of BPH and bladder outlet obstruction and bothersome urinary symptoms has been managed with tamsulosin 0.8 mg daily and finasteride with persistent symptoms.  He is elected TURP for further management.  Procedure: He was taken the operating room where he was given 2 g of Ancef.  A general anesthetic was induced.  He was placed in lithotomy position and fitted with PAS hose.  His perineum and genitalia were prepped with Betadine solution he was draped in usual sterile fashion.  Cystoscopy was performed using a 21 [place] scope and 30 degree lens.  Examination demonstrated normal urethra.  The external sphincter was intact.  The prostatic urethra was approximately 3 to 4 cm in length with primarily right lateral lobe enlargement with obstruction.  There was no left lateral lobe hypertrophy or a middle lobe.  Examination of bladder revealed moderate trabeculation with a 1.5 cm diverticulum on the left posterior wall.  The mucosa was unremarkable.  Ureteral orifices were in the normal anatomic position away from the bladder neck.  The urethra was then calibrated to 32 [place] with [employer] and the 26 French continuous-flow resectoscope sheath placed the aid of visual obturator.  The visual obturator was removed and an Beatrix Fetters handle with a bipolar loop and 30 degree lens was placed.  The right lateral lobe of the prostate was then resected from bladder neck to apex out to the capsular fibers.  Some residual apical and anterior tissue was resected as well.  After that resection of this lobe there was no residual obstruction and appeared to be no  need to resect the right lobe of the bladder neck further.  The bladder was evacuated free of chips and final inspection was performed.  There were no retained chips, the ureteral orifices were intact, there was no active bleeding and the external sphincter was intact.  Upon removal of the scope pressure on the bladder produced an excellent stream.  A 22 French three-way Foley was placed with the aid of a catheter guide.  The balloon was filled with 30 mL of sterile fluid.  The catheter was then hand irrigated with clear return and then placed to continuous irrigation and straight drainage.  He was taken down from lithotomy position, his anesthetic was reversed and he was moved to recovery in stable condition.  There were no complications.

## 2022-01-24 NOTE — Anesthesia Preprocedure Evaluation (Signed)
Anesthesia Evaluation  Patient identified by MRN, date of birth, ID band Patient awake    Reviewed: Allergy & Precautions, H&P , NPO status , Patient's Chart, lab work & pertinent test results  Airway Mallampati: II  TM Distance: >3 FB Neck ROM: Full    Dental no notable dental hx.    Pulmonary asthma    Pulmonary exam normal breath sounds clear to auscultation       Cardiovascular negative cardio ROS Normal cardiovascular exam Rhythm:Regular Rate:Normal     Neuro/Psych negative neurological ROS  negative psych ROS   GI/Hepatic negative GI ROS, Neg liver ROS,,,  Endo/Other  negative endocrine ROS    Renal/GU negative Renal ROS  negative genitourinary   Musculoskeletal negative musculoskeletal ROS (+)    Abdominal   Peds negative pediatric ROS (+)  Hematology negative hematology ROS (+)   Anesthesia Other Findings   Reproductive/Obstetrics negative OB ROS                             Anesthesia Physical Anesthesia Plan  ASA: 2  Anesthesia Plan: General   Post-op Pain Management: Dilaudid IV and Ofirmev IV (intra-op)*   Induction: Intravenous  PONV Risk Score and Plan: 2 and Ondansetron, Midazolam and Treatment may vary due to age or medical condition  Airway Management Planned: LMA  Additional Equipment:   Intra-op Plan:   Post-operative Plan: Extubation in OR  Informed Consent: I have reviewed the patients History and Physical, chart, labs and discussed the procedure including the risks, benefits and alternatives for the proposed anesthesia with the patient or authorized representative who has indicated his/her understanding and acceptance.     Dental advisory given  Plan Discussed with: CRNA  Anesthesia Plan Comments:        Anesthesia Quick Evaluation

## 2022-01-24 NOTE — Anesthesia Postprocedure Evaluation (Signed)
Anesthesia Post Note  Patient: Shaun Lee  Procedure(s) Performed: TRANSURETHRAL RESECTION OF THE PROSTATE (TURP) (Penis)     Patient location during evaluation: PACU Anesthesia Type: General Level of consciousness: awake and alert and oriented Pain management: pain level controlled Vital Signs Assessment: post-procedure vital signs reviewed and stable Respiratory status: spontaneous breathing, nonlabored ventilation and respiratory function stable Cardiovascular status: blood pressure returned to baseline and stable Postop Assessment: no apparent nausea or vomiting Anesthetic complications: no   No notable events documented.  Last Vitals:  Vitals:   01/24/22 0930 01/24/22 0945  BP: 121/61 117/64  Pulse: 63 63  Resp: 13 12  Temp:    SpO2: 97% 95%    Last Pain:  Vitals:   01/24/22 0945  TempSrc:   PainSc: 0-No pain                 Lael Wetherbee A.

## 2022-01-24 NOTE — Plan of Care (Signed)

## 2022-01-25 ENCOUNTER — Encounter (HOSPITAL_COMMUNITY): Payer: Self-pay | Admitting: Urology

## 2022-01-25 DIAGNOSIS — Z79899 Other long term (current) drug therapy: Secondary | ICD-10-CM | POA: Diagnosis not present

## 2022-01-25 DIAGNOSIS — N410 Acute prostatitis: Secondary | ICD-10-CM | POA: Diagnosis not present

## 2022-01-25 DIAGNOSIS — N320 Bladder-neck obstruction: Secondary | ICD-10-CM | POA: Diagnosis not present

## 2022-01-25 DIAGNOSIS — N411 Chronic prostatitis: Secondary | ICD-10-CM | POA: Diagnosis not present

## 2022-01-25 DIAGNOSIS — Z85828 Personal history of other malignant neoplasm of skin: Secondary | ICD-10-CM | POA: Diagnosis not present

## 2022-01-25 DIAGNOSIS — N401 Enlarged prostate with lower urinary tract symptoms: Secondary | ICD-10-CM | POA: Diagnosis not present

## 2022-01-25 DIAGNOSIS — N403 Nodular prostate with lower urinary tract symptoms: Secondary | ICD-10-CM | POA: Diagnosis not present

## 2022-01-25 DIAGNOSIS — J45909 Unspecified asthma, uncomplicated: Secondary | ICD-10-CM | POA: Diagnosis not present

## 2022-01-25 LAB — SURGICAL PATHOLOGY

## 2022-01-25 NOTE — Discharge Instructions (Signed)
Transurethral Resection of the Prostate (TURP) or Greenlight laser ablation of the Prostate ° °Care After ° °Refer to this sheet in the next few weeks. These discharge instructions provide you with general information on caring for yourself after you leave the hospital. Your caregiver may also give you specific instructions. Your treatment has been planned according to the most current medical practices available, but unavoidable complications sometimes occur. If you have any problems or questions after discharge, please call your caregiver. ° °HOME CARE INSTRUCTIONS  ° °Medications °· You may receive medicine for pain management. As your level of discomfort decreases, adjustments in your pain medicines may be made.  °· Take all medicines as directed.  °· You may be given a medicine (antibiotic) to kill germs following surgery. Finish all medicines. Let your caregiver know if you have any side effects or problems from the medicine.  °· If you are on aspirin, it would be best not to restart the aspirin until the blood in the urine clears °Hygiene °· You can take a shower after surgery.  °· You should not take a bath while you still have the urethral catheter. °Activity °· You will be encouraged to get out of bed as much as possible and increase your activity level as tolerated.  °· Spend the first week in and around your home. For 3 weeks, avoid the following:  °· Straining.  °· Running.  °· Strenuous work.  °· Walks longer than a few blocks.  °· Riding for extended periods.  °· Sexual relations.  °· Do not lift heavy objects (more than 20 pounds) for at least 1 month. When lifting, use your arms instead of your abdominal muscles.  °· You will be encouraged to walk as tolerated. Do not exert yourself. Increase your activity level slowly. Remember that it is important to keep moving after an operation of any type. This cuts down on the possibility of developing blood clots.  °· Your caregiver will tell you when you  can resume driving and light housework. Discuss this at your first office visit after discharge. °Diet °· No special diet is ordered after a TURP. However, if you are on a special diet for another medical problem, it should be continued.  °· Normal fluid intake is usually recommended.  °· Avoid alcohol and caffeinated drinks for 2 weeks. They irritate the bladder. Decaffeinated drinks are okay.  °· Avoid spicy foods.  °Bladder Function °· For the first 10 days, empty the bladder whenever you feel a definite desire. Do not try to hold the urine for long periods of time.  °· Urinating once or twice a night even after you are healed is not uncommon.  °· You may see some recurrence of blood in the urine after discharge from the hospital. This usually happens within 2 weeks after the procedure.If this occurs, force fluids again as you did in the hospital and reduce your activity.  °Bowel Function °· You may experience some constipation after surgery. This can be minimized by increasing fluids and fiber in your diet. Drink enough water and fluids to keep your urine clear or pale yellow.  °· A stool softener may be prescribed for use at home. Do not strain to move your bowels.  °· If you are requiring increased pain medicine, it is important that you take stool softeners to prevent constipation. This will help to promote proper healing by reducing the need to strain to move your bowels.  °Sexual Activity °· Semen movement   after intercourse. Or, you may not have an ejaculation during erection. Ask your caregiver when you can resume sexual activity. Retrograde ejaculation and reduced semen discharge should not reduce one's pleasure of intercourse.  Postoperative Visit Arrange the date and time of your after surgery visit with your caregiver.  Return to Work After your recovery is complete, you will  be able to return to work and resume all activities. Your caregiver will inform you when you can return to work.    Changing the Drainage Bags Cleanse both ends of the clean bag with alcohol before changing.  Pinch off the rubber catheter to avoid urine spillage during the disconnection.  Disconnect the dirty bag and connect the clean one.  Empty the dirty bag carefully to avoid a urine spill.  Attach the new bag to the leg with tape or a leg strap.  Cleaning the Drainage Bags Whenever a drainage bag is disconnected, it must be cleaned quickly so it is ready for the next use.  Wash the bag in warm, soapy water.  Rinse the bag thoroughly with warm water.  Soak the bag for 30 minutes in a solution of white vinegar and water (1 cup vinegar to 1 quart warm water).  Rinse with warm water.  SEEK MEDICAL CARE IF:  You have chills or night sweats.  You are leaking around your catheter or have problems with your catheter. It is not uncommon to have sporadic leakage around your catheter as a result of bladder spasms. If the leakage stops, there is not much need for concern. If you are uncertain, call your caregiver.  You develop side effects that you think are coming from your medicines.  SEEK IMMEDIATE MEDICAL CARE IF:  You are suddenly unable to urinate. Check to see if there are any kinks in the drainage tubing that may cause this. If you cannot find any kinks, call your caregiver immediately. This is an emergency.  You develop shortness of breath or chest pains.  Bleeding persists or clots develop in your urine.  You have a fever.  You develop pain in your back or over your lower belly (abdomen).  You develop pain or swelling in your legs.  Any problems you are having get worse rather than better.  MAKE SURE YOU:  Understand these instructions.  Will watch your condition.  Will get help right away if you are not doing well or get worse.

## 2022-01-25 NOTE — Discharge Summary (Signed)
Date of admission: 01/24/2022  Date of discharge: 01/25/2022  Admission diagnosis: BPH w/ obstructive voiding symptoms  Discharge diagnosis: same  Secondary diagnoses:  Patient Active Problem List   Diagnosis Date Noted   BPH with obstruction/lower urinary tract symptoms 01/24/2022   History of skin cancer 11/06/2021   Lentigo 11/06/2021   Asthma 05/13/2018   Allergic rhinitis 05/13/2018   Inguinal hernia, right 07/28/2013   Spermatocele of epididymis, single 07/28/2013    Procedures performed: Procedure(s): TRANSURETHRAL RESECTION OF THE PROSTATE (TURP)  History and Physical: For full details, please see admission history and physical. Briefly, Shaun Lee is a 66 y.o. year old patient with LUTs.   Hospital Course: Patient tolerated the procedure well.  He was then transferred to the floor after an uneventful PACU stay.  His hospital course was uncomplicated.  On POD#1 he had met discharge criteria: was eating a regular diet, was up and ambulating independently,  pain was well controlled, was voiding without a catheter, and was ready to for discharge.  NAD Vitals:   01/24/22 1814 01/24/22 2135 01/25/22 0213 01/25/22 0446  BP: 121/62 117/70 122/64 127/74  Pulse: 83 72 65 71  Resp: _0 Temp: 98 F (36.7 C) 98.6 F (37 C) 98.7 F (37.1 C) 97.6 F (36.4 C)  TempSrc: Oral Oral Oral Oral  SpO2: 95% 97% 96% 97%  Weight:      Height:        Intake/Output Summary (Last 24 hours) at 01/25/2022 0746 Last data filed at 01/25/2022 0600 Gross per 24 hour  Intake 3627.83 ml  Output 5225 ml  Net -1597.17 ml   Non-labored breathing Abdomen is soft Foley draining clear urine - CBI off, foley then removed Extremity symmetric  Laboratory values:  No results for input(s): "WBC", "HGB", "HCT" in the last 72 hours. No results for input(s): "NA", "K", "CL", "CO2", "GLUCOSE", "BUN", "CREATININE", "CALCIUM" in the last 72 hours. No results for input(s): "LABPT", "INR" in  the last 72 hours. No results for input(s): "LABURIN" in the last 72 hours. No results found for this or any previous visit.  Disposition: Home  Discharge instruction: The patient was instructed to be ambulatory but told to refrain from heavy lifting, strenuous activity, or driving.   Discharge medications:  Allergies as of 01/25/2022   No Known Allergies      Medication List     TAKE these medications    albuterol 108 (90 Base) MCG/ACT inhaler Commonly known as: VENTOLIN HFA INHALE 2 PUFFS INTO THE LUNGS EVERY 4 (FOUR) HOURS AS NEEDED FOR WHEEZING OR SHORTNESS OF BREATH (COUGH, SHORTNESS OF BREATH OR WHEEZING.).   fexofenadine 180 MG tablet Commonly known as: ALLEGRA Take 180 mg by mouth daily as needed for allergies or rhinitis.   finasteride 5 MG tablet Commonly known as: PROSCAR Take 5 mg by mouth daily.   ibuprofen 200 MG tablet Commonly known as: ADVIL Take 400 mg by mouth every 6 (six) hours as needed for moderate pain.   Nasacort Allergy 24HR 55 MCG/ACT Aero nasal inhaler Generic drug: triamcinolone Place 2 sprays into the nose daily as needed (allergies).   sildenafil 100 MG tablet Commonly known as: VIAGRA Take 50-100 mg by mouth daily as needed for erectile dysfunction.        Followup:   Follow-up Information     Karen Kays, NP Follow up on 02/06/2022.   Specialty: Nurse Practitioner Why: [phone] Contact information: [address]  [place] [address] [phone]

## 2022-01-25 NOTE — Anesthesia Postprocedure Evaluation (Signed)
Anesthesia Post Note  Patient: JMARI PELC  Procedure(s) Performed: TRANSURETHRAL RESECTION OF THE PROSTATE (TURP) (Penis)     Patient location during evaluation: PACU Anesthesia Type: General Level of consciousness: awake and alert Pain management: pain level controlled Vital Signs Assessment: post-procedure vital signs reviewed and stable Respiratory status: spontaneous breathing, nonlabored ventilation and respiratory function stable Cardiovascular status: blood pressure returned to baseline and stable Postop Assessment: no apparent nausea or vomiting Anesthetic complications: no   No notable events documented.  Last Vitals:  Vitals:   01/25/22 0213 01/25/22 0446  BP: 122/64 127/74  Pulse: 65 71  Resp: 15 14  Temp: 37.1 C 36.4 C  SpO2: 96% 97%    Last Pain:  Vitals:   01/25/22 0446  TempSrc: Oral  PainSc:                  Lynda Rainwater

## 2022-01-25 NOTE — TOC Initial Note (Signed)
Transition of Care [facility]) - Initial/Assessment Note    Patient Details  Name: Shaun Lee MRN: [id number] Date of Birth: 1955-10-31  Transition of Care [facility] CM/SW Contact:    Leeroy Cha, RN Phone Number: 01/25/2022, 7:55 AM  Clinical Narrative:                 Patient admitted for outpt surg ad dcd to return home with self care.  Expected Discharge Plan: Home/Self Care Barriers to Discharge: No Barriers Identified   Patient Goals and CMS Choice Patient states their goals for this hospitalization and ongoing recovery are:: to go home          Expected Discharge Plan and Services   Discharge Planning Services: CM Consult   Living arrangements for the past 2 months: Single Family Home Expected Discharge Date: 01/25/22                                    Prior Living Arrangements/Services Living arrangements for the past 2 months: Single Family Home Lives with:: Spouse Patient language and need for interpreter reviewed:: Yes Do you feel safe going back to the place where you live?: Yes            Criminal Activity/Legal Involvement Pertinent to Current Situation/Hospitalization: No - Comment as needed  Activities of Daily Living Home Assistive Devices/Equipment: None ADL Screening (condition at time of admission) Patient's cognitive ability adequate to safely complete daily activities?: Yes Is the patient deaf or have difficulty hearing?: No Does the patient have difficulty seeing, even when wearing glasses/contacts?: No Does the patient have difficulty concentrating, remembering, or making decisions?: No Patient able to express need for assistance with ADLs?: Yes Does the patient have difficulty dressing or bathing?: No Independently performs ADLs?: Yes (appropriate for developmental age) Does the patient have difficulty walking or climbing stairs?: No Weakness of Legs: None Weakness of Arms/Hands: None  Permission Sought/Granted                   Emotional Assessment Appearance:: Appears stated age Attitude/Demeanor/Rapport: Engaged Affect (typically observed): Calm Orientation: : Oriented to Self, Oriented to Place, Oriented to  Time, Oriented to Situation Alcohol / Substance Use: Never Used Psych Involvement: No (comment)  Admission diagnosis:  BPH with obstruction/lower urinary tract symptoms [N40.1, N13.8] Patient Active Problem List   Diagnosis Date Noted   BPH with obstruction/lower urinary tract symptoms 01/24/2022   History of skin cancer 11/06/2021   Lentigo 11/06/2021   Asthma 05/13/2018   Allergic rhinitis 05/13/2018   Inguinal hernia, right 07/28/2013   Spermatocele of epididymis, single 07/28/2013   PCP:  Wendie Agreste, MD Pharmacy:   [facility] [place], [place] [address] Phone: [phone] Fax: [place], [place] [address] AT [facility] [address] [place] [address] Phone: [phone] Fax: [phone]     Social Determinants of Health ([place]) Social History: SDOH Screenings   Food Insecurity: No Food Insecurity (01/24/2022)  Housing: Low Risk  (01/24/2022)  Transportation Needs: No Transportation Needs (01/24/2022)  Utilities: Not At Risk (01/24/2022)  Alcohol Screen: Low Risk  (07/11/2021)  Depression (PHQ2-9): Low Risk  (11/06/2021)  Financial Resource Strain: Low Risk  (07/11/2021)  Physical Activity: Sufficiently Active (07/11/2021)  Social Connections: Moderately Integrated (07/11/2021)  Stress: No Stress Concern Present (07/11/2021)  Tobacco Use: Low Risk  (01/24/2022)   SDOH Interventions:     Readmission Risk Interventions   No data to display

## 2022-01-25 NOTE — Progress Notes (Signed)
Foley catheter removed per MD order.  Pt continues to have small amount of bloody drainage with clots from penis.  Instructed pt to notify RN with any new pain, bladder fullness, or inability to void.  Patient denies pain and discomfort at this moment.  Assisted patient to chair and encouraged patient to drink fluids.  Angie Fava, RN

## 2022-01-25 NOTE — Progress Notes (Signed)
String of bottles of urine x5 collected, urine progressively clearer (now color of grapefruit juice).  Angie Fava, RN

## 2022-02-06 DIAGNOSIS — R3912 Poor urinary stream: Secondary | ICD-10-CM | POA: Diagnosis not present

## 2022-02-06 DIAGNOSIS — R351 Nocturia: Secondary | ICD-10-CM | POA: Diagnosis not present

## 2022-02-06 DIAGNOSIS — N403 Nodular prostate with lower urinary tract symptoms: Secondary | ICD-10-CM | POA: Diagnosis not present

## 2022-03-28 ENCOUNTER — Encounter: Payer: Self-pay | Admitting: Family Medicine

## 2022-03-28 ENCOUNTER — Ambulatory Visit (INDEPENDENT_AMBULATORY_CARE_PROVIDER_SITE_OTHER): Payer: Medicare Other | Admitting: Family Medicine

## 2022-03-28 VITALS — BP 126/70 | HR 82 | Temp 97.8°F | Ht 70.25 in | Wt 155.2 lb

## 2022-03-28 DIAGNOSIS — Z9889 Other specified postprocedural states: Secondary | ICD-10-CM | POA: Diagnosis not present

## 2022-03-28 DIAGNOSIS — Z23 Encounter for immunization: Secondary | ICD-10-CM | POA: Diagnosis not present

## 2022-03-28 DIAGNOSIS — Z0000 Encounter for general adult medical examination without abnormal findings: Secondary | ICD-10-CM

## 2022-03-28 DIAGNOSIS — Z131 Encounter for screening for diabetes mellitus: Secondary | ICD-10-CM

## 2022-03-28 DIAGNOSIS — Z130 Encounter for screening for diseases of the blood and blood-forming organs and certain disorders involving the immune mechanism: Secondary | ICD-10-CM

## 2022-03-28 DIAGNOSIS — E785 Hyperlipidemia, unspecified: Secondary | ICD-10-CM

## 2022-03-28 LAB — COMPREHENSIVE METABOLIC PANEL
ALT: 23 U/L (ref 0–53)
AST: 22 U/L (ref 0–37)
Albumin: 4.5 g/dL (ref 3.5–5.2)
Alkaline Phosphatase: 61 U/L (ref 39–117)
BUN: 18 mg/dL (ref 6–23)
CO2: 32 mEq/L (ref 19–32)
Calcium: 10.4 mg/dL (ref 8.4–10.5)
Chloride: 101 mEq/L (ref 96–112)
Creatinine, Ser: 0.93 mg/dL (ref 0.40–1.50)
GFR: 84.72 mL/min (ref >60.00)
Glucose, Bld: 85 mg/dL (ref 70–99)
Potassium: 4.7 mEq/L (ref 3.5–5.1)
Sodium: 140 mEq/L (ref 135–145)
Total Bilirubin: 1 mg/dL (ref 0.2–1.2)
Total Protein: 7.5 g/dL (ref 6.0–8.3)

## 2022-03-28 LAB — CBC WITH DIFFERENTIAL/PLATELET
Basophils Absolute: 0 10*3/uL (ref 0.0–0.1)
Basophils Relative: 0.6 % (ref 0.0–3.0)
Eosinophils Absolute: 0 10*3/uL (ref 0.0–0.7)
Eosinophils Relative: 0.6 % (ref 0.0–5.0)
HCT: 48.9 % (ref 39.0–52.0)
Hemoglobin: 16.8 g/dL (ref 13.0–17.0)
Lymphocytes Relative: 13.4 % (ref 12.0–46.0)
Lymphs Abs: 0.8 10*3/uL (ref 0.7–4.0)
MCHC: 34.3 g/dL (ref 30.0–36.0)
MCV: 94 fl (ref 78.0–100.0)
Monocytes Absolute: 0.6 10*3/uL (ref 0.1–1.0)
Monocytes Relative: 9.2 % (ref 3.0–12.0)
Neutro Abs: 4.8 10*3/uL (ref 1.4–7.7)
Neutrophils Relative %: 76.2 % (ref 43.0–77.0)
Platelets: 147 10*3/uL — ABNORMAL LOW (ref 150.0–400.0)
RBC: 5.2 Mil/uL (ref 4.22–5.81)
RDW: 13.4 % (ref 11.5–15.5)
WBC: 6.3 10*3/uL (ref 4.0–10.5)

## 2022-03-28 LAB — LIPID PANEL
Cholesterol: 175 mg/dL (ref 0–200)
HDL: 40.6 mg/dL (ref >39.00)
LDL Cholesterol: 115 mg/dL — ABNORMAL HIGH (ref 0–99)
NonHDL: 134.21
Total CHOL/HDL Ratio: 4
Triglycerides: 97 mg/dL (ref 0.0–149.0)
VLDL: 19.4 mg/dL (ref 0.0–40.0)

## 2022-03-28 NOTE — Patient Instructions (Addendum)
Call for appointment with dermatology for yearly follow up.  Screening eye exam at some point may be beneficial.  Depending on cholesterol levels and 10-year heart disease risk score, we have the option of a coronary calcium test to help decide on medication for cholesterol.  As we discussed I will look at your labs first.  No med changes for now. Thanks for coming in today.  Follow-up in 1 year for physical but let me know if there are any questions or concerns sooner.  Preventive Care 60 Years and Older, Male Preventive care refers to lifestyle choices and visits with your health care provider that can promote health and wellness. Preventive care visits are also called wellness exams. What can I expect for my preventive care visit? Counseling During your preventive care visit, your health care provider may ask about your: Medical history, including: Past medical problems. Family medical history. History of falls. Current health, including: Emotional well-being. Home life and relationship well-being. Sexual activity. Memory and ability to understand (cognition). Lifestyle, including: Alcohol, nicotine or tobacco, and drug use. Access to firearms. Diet, exercise, and sleep habits. Work and work Statistician. Sunscreen use. Safety issues such as seatbelt and bike helmet use. Physical exam Your health care provider will check your: Height and weight. These may be used to calculate your BMI (body mass index). BMI is a measurement that tells if you are at a healthy weight. Waist circumference. This measures the distance around your waistline. This measurement also tells if you are at a healthy weight and may help predict your risk of certain diseases, such as type 2 diabetes and high blood pressure. Heart rate and blood pressure. Body temperature. Skin for abnormal spots. What immunizations do I need?  Vaccines are usually given at various ages, according to a schedule. Your health care  provider will recommend vaccines for you based on your age, medical history, and lifestyle or other factors, such as travel or where you work. What tests do I need? Screening Your health care provider may recommend screening tests for certain conditions. This may include: Lipid and cholesterol levels. Diabetes screening. This is done by checking your blood sugar (glucose) after you have not eaten for a while (fasting). Hepatitis C test. Hepatitis B test. HIV (human immunodeficiency virus) test. STI (sexually transmitted infection) testing, if you are at risk. Lung cancer screening. Colorectal cancer screening. Prostate cancer screening. Abdominal aortic aneurysm (AAA) screening. You may need this if you are a current or former smoker. Talk with your health care provider about your test results, treatment options, and if necessary, the need for more tests. Follow these instructions at home: Eating and drinking  Eat a diet that includes fresh fruits and vegetables, whole grains, lean protein, and low-fat dairy products. Limit your intake of foods with high amounts of sugar, saturated fats, and salt. Take vitamin and mineral supplements as recommended by your health care provider. Do not drink alcohol if your health care provider tells you not to drink. If you drink alcohol: Limit how much you have to 0-2 drinks a day. Know how much alcohol is in your drink. In the [place] one drink equals one 12 oz bottle of beer (355 mL), one 5 oz glass of wine (148 mL), or one 1 oz glass of hard liquor (44 mL). Lifestyle Brush your teeth every morning and night with fluoride toothpaste. Floss one time each day. Exercise for at least 30 minutes 5 or more days each week. Do not use any  products that contain nicotine or tobacco. These products include cigarettes, chewing tobacco, and vaping devices, such as e-cigarettes. If you need help quitting, ask your health care provider. Do not use drugs. If you are  sexually active, practice safe sex. Use a condom or other form of protection to prevent STIs. Take aspirin only as told by your health care provider. Make sure that you understand how much to take and what form to take. Work with your health care provider to find out whether it is safe and beneficial for you to take aspirin daily. Ask your health care provider if you need to take a cholesterol-lowering medicine (statin). Find healthy ways to manage stress, such as: Meditation, yoga, or listening to music. Journaling. Talking to a trusted person. Spending time with friends and family. Safety Always wear your seat belt while driving or riding in a vehicle. Do not drive: If you have been drinking alcohol. Do not ride with someone who has been drinking. When you are tired or distracted. While texting. If you have been using any mind-altering substances or drugs. Wear a helmet and other protective equipment during sports activities. If you have firearms in your house, make sure you follow all gun safety procedures. Minimize exposure to UV radiation to reduce your risk of skin cancer. What's next? Visit your health care provider once a year for an annual wellness visit. Ask your health care provider how often you should have your eyes and teeth checked. Stay up to date on all vaccines. This information is not intended to replace advice given to you by your health care provider. Make sure you discuss any questions you have with your health care provider. Document Revised: 07/12/2020 Document Reviewed: 07/12/2020 Elsevier Patient Education  [place].

## 2022-03-28 NOTE — Progress Notes (Signed)
Subjective:  Patient ID: Shaun Lee, male    DOB: 26-Aug-1955  Age: 67 y.o. MRN: [id number]  CC:  Chief Complaint  Patient presents with   Annual Exam    Pt is fasting     HPI Shaun Lee presents for Annual Exam, last visit with me in 2022. No changes in health.  Spending time with 30 month old grandson Shaun Lee few days per week.   History of allergic rhinitis, treated with Allegra, Nasacort - uses intermittently with allergy season. Working well.   Asthma, mild intermittent, albuterol as needed - no recent need. Asthma improved in retirement - past 3 years.   History of skin cancer, followed by dermatology, Dr. Delman Cheadle. last visit with derm - few years ago.   Followed by urology, Dr. Jeffie Pollock with BPH, LUTS, status post TURP December 2023. Doing well - urinating better.   Hyperlipidemia: Elevated ASCVD risk score previously.  Statins have been recommended, concern for side effects.  Improved readings at his 2022 visit.  No current meds.   No FH of early CAD known.  The 10-year ASCVD risk score (Arnett DK, et al., 2019) is: 13.5%   Values used to calculate the score:     Age: 62 years     Sex: Male     Is Non-Hispanic African American: No     Diabetic: No     Tobacco smoker: No     Systolic Blood Pressure: 123XX123 mmHg     Is BP treated: No     HDL Cholesterol: 47 mg/dL     Total Cholesterol: 171 mg/dL   Lab Results  Component Value Date   CHOL 171 06/05/2020   HDL 47.00 06/05/2020   LDLCALC 110 (H) 06/05/2020   TRIG 70.0 06/05/2020   CHOLHDL 4 06/05/2020   Lab Results  Component Value Date   ALT 21 11/30/2020   AST 24 11/30/2020   ALKPHOS 54 11/30/2020   BILITOT 0.9 11/30/2020         03/28/2022    9:12 AM 11/06/2021    9:17 AM 07/11/2021    3:03 PM 07/11/2021    3:02 PM 06/05/2020   10:50 AM  Depression screen PHQ 2/9  Decreased Interest 0 0 0 0 0  Down, Depressed, Hopeless 0 0 0 0 0  PHQ - 2 Score 0 0 0 0 0  Altered sleeping 0 0     Tired, decreased  energy 0 0     Change in appetite 0 0     Feeling bad or failure about yourself  0 0     Trouble concentrating 0 0     Moving slowly or fidgety/restless 0 0     Suicidal thoughts 0 0     PHQ-9 Score 0 0     Difficult doing work/chores Not difficult at all        Health Maintenance  Topic Date Due   COVID-19 Vaccine (5 - 2023-24 season) 04/13/2022 (Originally 12/26/2021)   Pneumonia Vaccine 38+ Years old (2 of 2 - PPSV23 or PCV20) 11/07/2022 (Originally 06/05/2021)   Medicare Annual Wellness (AWV)  07/12/2022   DTaP/Tdap/Td (2 - Td or Tdap) 09/06/2025   COLONOSCOPY (Pts 45-19yr Insurance coverage will need to be confirmed)  11/13/2027   INFLUENZA VACCINE  Completed   Hepatitis C Screening  Completed   Zoster Vaccines- Shingrix  Completed   HPV VACCINES  Aged Out  Negative Cologuard 11/06/2020 Prostate: BPH, status post TURP as above, followed  by urology.  Immunization History  Administered Date(s) Administered   Fluad Quad(high Dose 65+) 10/31/2021   Influenza-Unspecified 12/04/2018   PFIZER Comirnaty(Gray Top)Covid-19 Tri-Sucrose Vaccine 04/11/2019, 05/01/2019, 11/16/2019   Pfizer Covid-19 Vaccine Bivalent Booster 26yr & up 10/31/2021   Pneumococcal Conjugate-13 06/05/2020   Tdap 09/07/2015   Zoster Recombinat (Shingrix) 06/05/2020, 10/13/2020  Prevnar in 2022, due for Pneumovax.  COVID booster - 10/31/21.  RSV vaccine: discussed - declined for now.   No results found. No corrective lenses. No recent optho eval.   Dental:every 6 months.   Alcohol: rare, none since December.   Tobacco: none, no vaping.   Exercise: walking, biking - most days per week. Over 1563m/week.    History Patient Active Problem List   Diagnosis Date Noted   BPH with obstruction/lower urinary tract symptoms 01/24/2022   History of skin cancer 11/06/2021   Lentigo 11/06/2021   Asthma 05/13/2018   Allergic rhinitis 05/13/2018   Inguinal hernia, right 07/28/2013   Spermatocele of  epididymis, single 07/28/2013   Past Medical History:  Diagnosis Date   Allergy    Asthma    Cancer [place]   skin cancer   Past Surgical History:  Procedure Laterality Date   HERNIA REPAIR     TRANSURETHRAL RESECTION OF PROSTATE N/A 01/24/2022   Procedure: TRANSURETHRAL RESECTION OF THE PROSTATE (TURP);  Surgeon: WrIrine SealMD;  Location: [facility];  Service: Urology;  Laterality: N/A;   No Known Allergies Prior to Admission medications   Medication Sig Start Date End Date Taking? Authorizing Provider  albuterol (VENTOLIN HFA) 108 (90 Base) MCG/ACT inhaler INHALE 2 PUFFS INTO THE LUNGS EVERY 4 (FOUR) HOURS AS NEEDED FOR WHEEZING OR SHORTNESS OF BREATH (COUGH, SHORTNESS OF BREATH OR WHEEZING.). 03/30/19  Yes GrWendie AgresteMD  fexofenadine (ALLEGRA) 180 MG tablet Take 180 mg by mouth daily as needed for allergies or rhinitis.   Yes [provider]  ibuprofen (ADVIL) 200 MG tablet Take 400 mg by mouth every 6 (six) hours as needed for moderate pain.   Yes [provider]  sildenafil (VIAGRA) 100 MG tablet Take 50-100 mg by mouth daily as needed for erectile dysfunction. 12/24/21  Yes [provider]  triamcinolone (NASACORT ALLERGY 24HR) 55 MCG/ACT AERO nasal inhaler Place 2 sprays into the nose daily as needed (allergies).   Yes [provider]   Social History   Socioeconomic History   Marital status: Married    Spouse name: Not on file   Number of children: 1   Years of education: Not on file   Highest education level: Not on file  Occupational History   Not on file  Tobacco Use   Smoking status: Never   Smokeless tobacco: Never  Vaping Use   Vaping Use: Never used  Substance and Sexual Activity   Alcohol use: Yes    Comment: rare occasion    Drug use: No   Sexual activity: Not Currently  Other Topics Concern   Not on file  Social History Narrative   Not on file   Social Determinants of Health   Financial Resource Strain: Low  Risk  (07/11/2021)   Overall Financial Resource Strain (CARDIA)    Difficulty of Paying Living Expenses: Not hard at all  Food Insecurity: No Food Insecurity (01/24/2022)   Hunger Vital Sign    Worried About Running Out of Food in the Last Year: Never true    Ran Out of Food in the Last Year: Never true  Transportation Needs: No Transportation Needs (01/24/2022)   PRAPARE - Hydrologist (Medical): No    Lack of Transportation (Non-Medical): No  Physical Activity: Sufficiently Active (07/11/2021)   Exercise Vital Sign    Days of Exercise per Week: 5 days    Minutes of Exercise per Session: 60 min  Stress: No Stress Concern Present (07/11/2021)   [place]    Feeling of Stress : Not at all  Social Connections: Moderately Integrated (07/11/2021)   Social Connection and Isolation Panel [NHANES]    Frequency of Communication with Friends and Family: Three times a week    Frequency of Social Gatherings with Friends and Family: Three times a week    Attends Religious Services: More than 4 times per year    Active Member of Clubs or Organizations: No    Attends Archivist Meetings: Never    Marital Status: Married  Human resources officer Violence: Not At Risk (01/24/2022)   Humiliation, Afraid, Rape, and Kick questionnaire    Fear of Current or Ex-Partner: No    Emotionally Abused: No    Physically Abused: No    Sexually Abused: No    Review of Systems  13 point review of systems per patient health survey noted.  Negative other than as indicated above or in HPI.   Objective:   Vitals:   03/28/22 0914  BP: 126/70  Pulse: 82  Temp: 97.8 F (36.6 C)  TempSrc: Temporal  SpO2: 97%  Weight: 155 lb 3.2 oz (70.4 kg)  Height: 5' 10.25" (1.784 m)     Physical Exam Vitals reviewed.  Constitutional:      Appearance: He is well-developed.  HENT:     Head: Normocephalic and atraumatic.      Right Ear: External ear normal.     Left Ear: External ear normal.  Eyes:     Conjunctiva/sclera: Conjunctivae normal.     Pupils: Pupils are equal, round, and reactive to light.  Neck:     Thyroid: No thyromegaly.  Cardiovascular:     Rate and Rhythm: Normal rate and regular rhythm.     Heart sounds: Normal heart sounds.  Pulmonary:     Effort: Pulmonary effort is normal. No respiratory distress.     Breath sounds: Normal breath sounds. No wheezing.  Abdominal:     General: There is no distension.     Palpations: Abdomen is soft.     Tenderness: There is no abdominal tenderness.  Musculoskeletal:        General: No tenderness. Normal range of motion.     Cervical back: Normal range of motion and neck supple.  Lymphadenopathy:     Cervical: No cervical adenopathy.  Skin:    General: Skin is warm and dry.  Neurological:     Mental Status: He is alert and oriented to person, place, and time.     Deep Tendon Reflexes: Reflexes are normal and symmetric.  Psychiatric:        Behavior: Behavior normal.    Vision Screening   Right eye Left eye Both eyes  Without correction '20/20 20/25 20/20 '$  With correction        Assessment & Plan:  DERRIS VANDEWATER is a 67 y.o. male . Annual physical exam - Plan: CBC with Differential/Platelet, Lipid panel, Comprehensive metabolic panel  - -anticipatory guidance as below in AVS, screening labs above. Health maintenance items as above in HPI discussed/recommended  as applicable.   Dermatology follow-up recommended, as well as Optho/optometry exam.  Hyperlipidemia, unspecified hyperlipidemia type - Plan: Lipid panel, Comprehensive metabolic panel  -Check labs, ASCVD risk scoring, borderline previously.  Consider coronary calcium scoring to help decide on statin.  No new meds for now  Screening for diabetes mellitus - Plan: Comprehensive metabolic panel  Screening for deficiency anemia - Plan: CBC with Differential/Platelet History of  prostate surgery - Plan: CBC with Differential/Platelet  -Doing well since TURP procedure by urology.  Continue routine urologic follow-up.  Need for pneumococcal vaccination - Plan: Pneumococcal polysaccharide vaccine 23-valent greater than or equal to 2yo subcutaneous/IM   No orders of the defined types were placed in this encounter.  Patient Instructions  Call for appointment with dermatology for yearly follow up.  Screening eye exam at some point may be beneficial.  Depending on cholesterol levels and 10-year heart disease risk score, we have the option of a coronary calcium test to help decide on medication for cholesterol.  As we discussed I will look at your labs first.  No med changes for now. Thanks for coming in today.  Follow-up in 1 year for physical but let me know if there are any questions or concerns sooner.  Preventive Care 25 Years and Older, Male Preventive care refers to lifestyle choices and visits with your health care provider that can promote health and wellness. Preventive care visits are also called wellness exams. What can I expect for my preventive care visit? Counseling During your preventive care visit, your health care provider may ask about your: Medical history, including: Past medical problems. Family medical history. History of falls. Current health, including: Emotional well-being. Home life and relationship well-being. Sexual activity. Memory and ability to understand (cognition). Lifestyle, including: Alcohol, nicotine or tobacco, and drug use. Access to firearms. Diet, exercise, and sleep habits. Work and work Statistician. Sunscreen use. Safety issues such as seatbelt and bike helmet use. Physical exam Your health care provider will check your: Height and weight. These may be used to calculate your BMI (body mass index). BMI is a measurement that tells if you are at a healthy weight. Waist circumference. This measures the distance around  your waistline. This measurement also tells if you are at a healthy weight and may help predict your risk of certain diseases, such as type 2 diabetes and high blood pressure. Heart rate and blood pressure. Body temperature. Skin for abnormal spots. What immunizations do I need?  Vaccines are usually given at various ages, according to a schedule. Your health care provider will recommend vaccines for you based on your age, medical history, and lifestyle or other factors, such as travel or where you work. What tests do I need? Screening Your health care provider may recommend screening tests for certain conditions. This may include: Lipid and cholesterol levels. Diabetes screening. This is done by checking your blood sugar (glucose) after you have not eaten for a while (fasting). Hepatitis C test. Hepatitis B test. HIV (human immunodeficiency virus) test. STI (sexually transmitted infection) testing, if you are at risk. Lung cancer screening. Colorectal cancer screening. Prostate cancer screening. Abdominal aortic aneurysm (AAA) screening. You may need this if you are a current or former smoker. Talk with your health care provider about your test results, treatment options, and if necessary, the need for more tests. Follow these instructions at home: Eating and drinking  Eat a diet that includes fresh fruits and vegetables, whole grains, lean protein, and low-fat dairy products.  Limit your intake of foods with high amounts of sugar, saturated fats, and salt. Take vitamin and mineral supplements as recommended by your health care provider. Do not drink alcohol if your health care provider tells you not to drink. If you drink alcohol: Limit how much you have to 0-2 drinks a day. Know how much alcohol is in your drink. In the [place] one drink equals one 12 oz bottle of beer (355 mL), one 5 oz glass of wine (148 mL), or one 1 oz glass of hard liquor (44 mL). Lifestyle Brush your teeth every  morning and night with fluoride toothpaste. Floss one time each day. Exercise for at least 30 minutes 5 or more days each week. Do not use any products that contain nicotine or tobacco. These products include cigarettes, chewing tobacco, and vaping devices, such as e-cigarettes. If you need help quitting, ask your health care provider. Do not use drugs. If you are sexually active, practice safe sex. Use a condom or other form of protection to prevent STIs. Take aspirin only as told by your health care provider. Make sure that you understand how much to take and what form to take. Work with your health care provider to find out whether it is safe and beneficial for you to take aspirin daily. Ask your health care provider if you need to take a cholesterol-lowering medicine (statin). Find healthy ways to manage stress, such as: Meditation, yoga, or listening to music. Journaling. Talking to a trusted person. Spending time with friends and family. Safety Always wear your seat belt while driving or riding in a vehicle. Do not drive: If you have been drinking alcohol. Do not ride with someone who has been drinking. When you are tired or distracted. While texting. If you have been using any mind-altering substances or drugs. Wear a helmet and other protective equipment during sports activities. If you have firearms in your house, make sure you follow all gun safety procedures. Minimize exposure to UV radiation to reduce your risk of skin cancer. What's next? Visit your health care provider once a year for an annual wellness visit. Ask your health care provider how often you should have your eyes and teeth checked. Stay up to date on all vaccines. This information is not intended to replace advice given to you by your health care provider. Make sure you discuss any questions you have with your health care provider. Document Revised: 07/12/2020 Document Reviewed: 07/12/2020 Elsevier Patient  Education  [place],   [name], MD [place] [place] [facility] 03/28/22 9:47 AM

## 2022-04-03 ENCOUNTER — Encounter: Payer: Self-pay | Admitting: Family Medicine

## 2022-04-09 ENCOUNTER — Telehealth: Payer: Self-pay | Admitting: Family Medicine

## 2022-04-09 DIAGNOSIS — E785 Hyperlipidemia, unspecified: Secondary | ICD-10-CM

## 2022-04-09 NOTE — Addendum Note (Signed)
Addended by: [name] R on: 04/09/2022 11:23 AM   Modules accepted: Orders

## 2022-04-09 NOTE — Telephone Encounter (Signed)
ordered

## 2022-04-09 NOTE — Telephone Encounter (Signed)
Ok to order scan,

## 2022-04-09 NOTE — Telephone Encounter (Signed)
Caller name: Shaun Lee  On DPR?: Yes  Call back number: [phone] (mobile)  Provider they see: Wendie Agreste, MD  Reason for call: Patient called stating that him and Dr.Greene discussed a coronary calcium scan. Patient is giving Dr.Greene the okay to order that scan for him.

## 2022-04-12 DIAGNOSIS — D2271 Melanocytic nevi of right lower limb, including hip: Secondary | ICD-10-CM | POA: Diagnosis not present

## 2022-04-12 DIAGNOSIS — D225 Melanocytic nevi of trunk: Secondary | ICD-10-CM | POA: Diagnosis not present

## 2022-04-12 DIAGNOSIS — D485 Neoplasm of uncertain behavior of skin: Secondary | ICD-10-CM | POA: Diagnosis not present

## 2022-04-12 DIAGNOSIS — Z1283 Encounter for screening for malignant neoplasm of skin: Secondary | ICD-10-CM | POA: Diagnosis not present

## 2022-04-30 ENCOUNTER — Ambulatory Visit (HOSPITAL_COMMUNITY)
Admission: RE | Admit: 2022-04-30 | Discharge: 2022-04-30 | Disposition: A | Discharge status: Home / Self Care | Payer: Medicare Other | Source: Ambulatory Visit | Attending: Family Medicine | Admitting: Family Medicine

## 2022-04-30 DIAGNOSIS — E785 Hyperlipidemia, unspecified: Secondary | ICD-10-CM

## 2022-05-07 ENCOUNTER — Encounter: Payer: Self-pay | Admitting: Family Medicine

## 2022-05-07 DIAGNOSIS — E785 Hyperlipidemia, unspecified: Secondary | ICD-10-CM

## 2022-05-07 DIAGNOSIS — R931 Abnormal findings on diagnostic imaging of heart and coronary circulation: Secondary | ICD-10-CM

## 2022-05-08 DIAGNOSIS — N5201 Erectile dysfunction due to arterial insufficiency: Secondary | ICD-10-CM | POA: Diagnosis not present

## 2022-05-08 DIAGNOSIS — R972 Elevated prostate specific antigen [PSA]: Secondary | ICD-10-CM | POA: Diagnosis not present

## 2022-05-08 DIAGNOSIS — R350 Frequency of micturition: Secondary | ICD-10-CM | POA: Diagnosis not present

## 2022-05-08 DIAGNOSIS — N403 Nodular prostate with lower urinary tract symptoms: Secondary | ICD-10-CM | POA: Diagnosis not present

## 2022-05-08 MED ORDER — ATORVASTATIN CALCIUM 10 MG PO TABS: 10.0000 mg | ORAL_TABLET | Freq: Every day | ORAL | 0 refills | Status: DC

## 2022-05-09 DIAGNOSIS — X32XXXD Exposure to sunlight, subsequent encounter: Secondary | ICD-10-CM | POA: Diagnosis not present

## 2022-05-09 DIAGNOSIS — L570 Actinic keratosis: Secondary | ICD-10-CM | POA: Diagnosis not present

## 2022-07-02 ENCOUNTER — Telehealth: Payer: Self-pay | Admitting: Family Medicine

## 2022-07-02 NOTE — Telephone Encounter (Signed)
Called patient to schedule Medicare Annual Wellness Visit (AWV). Left message for patient to call back and schedule Medicare Annual Wellness Visit (AWV).  Last date of AWV: 07/11/2021   Please schedule an AWVS appointment at any time with [facility] ANNUAL WELLNESS VISIT.  If any questions, please contact me at [phone].    Thank you,  [facility] Support [facility] Direct dial  [phone]

## 2022-07-25 ENCOUNTER — Ambulatory Visit (INDEPENDENT_AMBULATORY_CARE_PROVIDER_SITE_OTHER): Payer: Medicare Other | Admitting: *Deleted

## 2022-07-25 DIAGNOSIS — Z0000 Encounter for general adult medical examination without abnormal findings: Secondary | ICD-10-CM | POA: Diagnosis not present

## 2022-07-25 NOTE — Patient Instructions (Signed)
Mr. Shaun Lee , Thank you for taking time to come for your Medicare Wellness Visit. I appreciate your ongoing commitment to your health goals. Please review the following plan we discussed and let me know if I can assist you in the future.   Screening recommendations/referrals: Colonoscopy: up to date Recommended yearly ophthalmology/optometry visit for glaucoma screening and checkup Recommended yearly dental visit for hygiene and checkup  Vaccinations: Influenza vaccine: up to date Pneumococcal vaccine: up to date Tdap vaccine: up to date Shingles vaccine: up to date    Advanced directives: Education provided     Preventive Care 68 Years and Older, Male Preventive care refers to lifestyle choices and visits with your health care provider that can promote health and wellness. What does preventive care include? A yearly physical exam. This is also called an annual well check. Dental exams once or twice a year. Routine eye exams. Ask your health care provider how often you should have your eyes checked. Personal lifestyle choices, including: Daily care of your teeth and gums. Regular physical activity. Eating a healthy diet. Avoiding tobacco and drug use. Limiting alcohol use. Practicing safe sex. Taking low doses of aspirin every day. Taking vitamin and mineral supplements as recommended by your health care provider. What happens during an annual well check? The services and screenings done by your health care provider during your annual well check will depend on your age, overall health, lifestyle risk factors, and family history of disease. Counseling  Your health care provider may ask you questions about your: Alcohol use. Tobacco use. Drug use. Emotional well-being. Home and relationship well-being. Sexual activity. Eating habits. History of falls. Memory and ability to understand (cognition). Work and work Astronomer. Screening  You may have the following tests or  measurements: Height, weight, and BMI. Blood pressure. Lipid and cholesterol levels. These may be checked every 5 years, or more frequently if you are over 4 years old. Skin check. Lung cancer screening. You may have this screening every year starting at age 33 if you have a 30-pack-year history of smoking and currently smoke or have quit within the past 15 years. Fecal occult blood test (FOBT) of the stool. You may have this test every year starting at age 50. Flexible sigmoidoscopy or colonoscopy. You may have a sigmoidoscopy every 5 years or a colonoscopy every 10 years starting at age 68. Prostate cancer screening. Recommendations will vary depending on your family history and other risks. Hepatitis C blood test. Hepatitis B blood test. Sexually transmitted disease (STD) testing. Diabetes screening. This is done by checking your blood sugar (glucose) after you have not eaten for a while (fasting). You may have this done every 1-3 years. Abdominal aortic aneurysm (AAA) screening. You may need this if you are a current or former smoker. Osteoporosis. You may be screened starting at age 48 if you are at high risk. Talk with your health care provider about your test results, treatment options, and if necessary, the need for more tests. Vaccines  Your health care provider may recommend certain vaccines, such as: Influenza vaccine. This is recommended every year. Tetanus, diphtheria, and acellular pertussis (Tdap, Td) vaccine. You may need a Td booster every 10 years. Zoster vaccine. You may need this after age 7. Pneumococcal 13-valent conjugate (PCV13) vaccine. One dose is recommended after age 8. Pneumococcal polysaccharide (PPSV23) vaccine. One dose is recommended after age 54. Talk to your health care provider about which screenings and vaccines you need and how often you need  them. This information is not intended to replace advice given to you by your health care provider. Make sure  you discuss any questions you have with your health care provider. Document Released: 02/10/2015 Document Revised: 10/04/2015 Document Reviewed: 11/15/2014 Elsevier Interactive Patient Education  2017 [place] Prevention in the Home Falls can cause injuries. They can happen to people of all ages. There are many things you can do to make your home safe and to help prevent falls. What can I do on the outside of my home? Regularly fix the edges of walkways and driveways and fix any cracks. Remove anything that might make you trip as you walk through a door, such as a raised step or threshold. Trim any bushes or trees on the path to your home. Use bright outdoor lighting. Clear any walking paths of anything that might make someone trip, such as rocks or tools. Regularly check to see if handrails are loose or broken. Make sure that both sides of any steps have handrails. Any raised decks and porches should have guardrails on the edges. Have any leaves, snow, or ice cleared regularly. Use sand or salt on walking paths during winter. Clean up any spills in your garage right away. This includes oil or grease spills. What can I do in the bathroom? Use night lights. Install grab bars by the toilet and in the tub and shower. Do not use towel bars as grab bars. Use non-skid mats or decals in the tub or shower. If you need to sit down in the shower, use a plastic, non-slip stool. Keep the floor dry. Clean up any water that spills on the floor as soon as it happens. Remove soap buildup in the tub or shower regularly. Attach bath mats securely with double-sided non-slip rug tape. Do not have throw rugs and other things on the floor that can make you trip. What can I do in the bedroom? Use night lights. Make sure that you have a light by your bed that is easy to reach. Do not use any sheets or blankets that are too big for your bed. They should not hang down onto the floor. Have a firm  chair that has side arms. You can use this for support while you get dressed. Do not have throw rugs and other things on the floor that can make you trip. What can I do in the kitchen? Clean up any spills right away. Avoid walking on wet floors. Keep items that you use a lot in easy-to-reach places. If you need to reach something above you, use a strong step stool that has a grab bar. Keep electrical cords out of the way. Do not use floor polish or wax that makes floors slippery. If you must use wax, use non-skid floor wax. Do not have throw rugs and other things on the floor that can make you trip. What can I do with my stairs? Do not leave any items on the stairs. Make sure that there are handrails on both sides of the stairs and use them. Fix handrails that are broken or loose. Make sure that handrails are as long as the stairways. Check any carpeting to make sure that it is firmly attached to the stairs. Fix any carpet that is loose or worn. Avoid having throw rugs at the top or bottom of the stairs. If you do have throw rugs, attach them to the floor with carpet tape. Make sure that you have a light switch at  the top of the stairs and the bottom of the stairs. If you do not have them, ask someone to add them for you. What else can I do to help prevent falls? Wear shoes that: Do not have high heels. Have rubber bottoms. Are comfortable and fit you well. Are closed at the toe. Do not wear sandals. If you use a stepladder: Make sure that it is fully opened. Do not climb a closed stepladder. Make sure that both sides of the stepladder are locked into place. Ask someone to hold it for you, if possible. Clearly mark and make sure that you can see: Any grab bars or handrails. First and last steps. Where the edge of each step is. Use tools that help you move around (mobility aids) if they are needed. These include: Canes. Walkers. Scooters. Crutches. Turn on the lights when you go  into a dark area. Replace any light bulbs as soon as they burn out. Set up your furniture so you have a clear path. Avoid moving your furniture around. If any of your floors are uneven, fix them. If there are any pets around you, be aware of where they are. Review your medicines with your doctor. Some medicines can make you feel dizzy. This can increase your chance of falling. Ask your doctor what other things that you can do to help prevent falls. This information is not intended to replace advice given to you by your health care provider. Make sure you discuss any questions you have with your health care provider. Document Released: 11/10/2008 Document Revised: 06/22/2015 Document Reviewed: 02/18/2014 Elsevier Interactive Patient Education  2017 [employer].

## 2022-07-25 NOTE — Progress Notes (Signed)
Subjective:   Shaun Lee is a 67 y.o. male who presents for Medicare Annual/Subsequent preventive examination.  Visit Complete: Virtual  I connected with  Shaun Lee on 07/25/22 by a audio enabled telemedicine application and verified that I am speaking with the correct person using two identifiers.  Patient Location: Home  Provider Location: Home Office  I discussed the limitations of evaluation and management by telemedicine. The patient expressed understanding and agreed to proceed.   Review of Systems     Cardiac Risk Factors include: advanced age (>68men, >38 women);male gender     Objective:    Today's Vitals   There is no height or weight on file to calculate BMI.     07/25/2022    9:35 AM 01/24/2022    5:49 AM 01/16/2022    8:50 AM 07/11/2021    3:03 PM 06/05/2020   10:50 AM  Advanced Directives  Does Patient Have a Medical Advance Directive? Yes Yes Yes Yes Yes  Type of Estate agent of [employer] Power of [place];Living will Living will;Healthcare Power of [employer] Power of [place];Living will   Does patient want to make changes to medical advance directive?  No - Patient declined   No - Patient declined  Copy of Healthcare Power of Attorney in Chart? No - copy requested No - copy requested  No - copy requested     Current Medications (verified) Outpatient Encounter Medications as of 07/25/2022  Medication Sig   albuterol (VENTOLIN HFA) 108 (90 Base) MCG/ACT inhaler INHALE 2 PUFFS INTO THE LUNGS EVERY 4 (FOUR) HOURS AS NEEDED FOR WHEEZING OR SHORTNESS OF BREATH (COUGH, SHORTNESS OF BREATH OR WHEEZING.).   atorvastatin (LIPITOR) 10 MG tablet Take 1 tablet (10 mg total) by mouth daily.   fexofenadine (ALLEGRA) 180 MG tablet Take 180 mg by mouth daily as needed for allergies or rhinitis.   ibuprofen (ADVIL) 200 MG tablet Take 400 mg by mouth every 6 (six) hours as needed for moderate pain.   sildenafil (VIAGRA) 100 MG  tablet Take 50-100 mg by mouth daily as needed for erectile dysfunction.   triamcinolone (NASACORT ALLERGY 24HR) 55 MCG/ACT AERO nasal inhaler Place 2 sprays into the nose daily as needed (allergies).   No facility-administered encounter medications on file as of 07/25/2022.    Allergies (verified) Patient has no known allergies.   History: Past Medical History:  Diagnosis Date   Allergy    Asthma    Cancer (HCC)    skin cancer   Past Surgical History:  Procedure Laterality Date   HERNIA REPAIR     TRANSURETHRAL RESECTION OF PROSTATE N/A 01/24/2022   Procedure: TRANSURETHRAL RESECTION OF THE PROSTATE (TURP);  Surgeon: Bjorn Pippin, MD;  Location: [facility];  Service: Urology;  Laterality: N/A;   Family History  Problem Relation Age of Onset   Diabetes Mother    Hypertension Mother    Kidney disease Father    Social History   Socioeconomic History   Marital status: Married    Spouse name: Not on file   Number of children: 1   Years of education: Not on file   Highest education level: Not on file  Occupational History   Not on file  Tobacco Use   Smoking status: Never   Smokeless tobacco: Never  Vaping Use   Vaping Use: Never used  Substance and Sexual Activity   Alcohol use: Yes    Comment: rare occasion    Drug use:  No   Sexual activity: Not Currently  Other Topics Concern   Not on file  Social History Narrative   Not on file   Social Determinants of Health   Financial Resource Strain: Low Risk  (07/25/2022)   Overall Financial Resource Strain (CARDIA)    Difficulty of Paying Living Expenses: Not hard at all  Food Insecurity: No Food Insecurity (07/25/2022)   Hunger Vital Sign    Worried About Running Out of Food in the Last Year: Never true    Ran Out of Food in the Last Year: Never true  Transportation Needs: No Transportation Needs (07/25/2022)   PRAPARE - Administrator, Civil Service (Medical): No    Lack of Transportation (Non-Medical): No   Physical Activity: Sufficiently Active (07/25/2022)   Exercise Vital Sign    Days of Exercise per Week: 4 days    Minutes of Exercise per Session: 50 min  Stress: No Stress Concern Present (07/25/2022)   [employer] of Occupational Health - Occupational Stress Questionnaire    Feeling of Stress : Not at all  Social Connections: Moderately Integrated (07/25/2022)   Social Connection and Isolation Panel [NHANES]    Frequency of Communication with Friends and Family: Lee than three times a week    Frequency of Social Gatherings with Friends and Family: Lee than three times a week    Attends Religious Services: Lee than 4 times per year    Active Member of [employer] or Organizations: No    Attends Engineer, structural: Never    Marital Status: Married    Tobacco Counseling Counseling given: Not Answered   Clinical Intake:  Pre-visit preparation completed: Yes  Pain : No/denies pain     Diabetes: No  How often do you need to have someone help you when you read instructions, pamphlets, or other written materials from your doctor or pharmacy?: 1 - Never  Interpreter Needed?: No  Information entered by :: Shaun Haggard LPN   Activities of Daily Living    07/25/2022    9:36 AM 01/24/2022    4:00 PM  In your present state of health, do you have any difficulty performing the following activities:  Hearing? 0 0  Vision? 0 0  Difficulty concentrating or making decisions? 0 0  Walking or climbing stairs? 0 0  Dressing or bathing? 0 0  Doing errands, shopping? 0 0  Preparing Food and eating ? N   Using the Toilet? N   In the past six months, have you accidently leaked urine? N   Do you have problems with loss of bowel control? N   Managing your Medications? N   Managing your Finances? N   Housekeeping or managing your Housekeeping? N     Patient Care Team: Shade Flood, MD as PCP - General (Family Medicine) Bjorn Pippin, MD as Attending Physician  (Urology)  Indicate any recent Medical Services you may have received from other than [facility] providers in the past year (date may be approximate).     Assessment:   This is a routine wellness examination for Shaun Lee.  Hearing/Vision screen Hearing Screening - Comments:: No trouble hearing Vision Screening - Comments:: Not up to date   Dietary issues and exercise activities discussed:     Goals Addressed             This Visit's Progress    Patient Stated       No goals       Depression  Screen    07/25/2022    9:39 AM 03/28/2022    9:12 AM 11/06/2021    9:17 AM 07/11/2021    3:03 PM 07/11/2021    3:02 PM 06/05/2020   10:50 AM 07/10/2018    8:08 AM  PHQ 2/9 Scores  PHQ - 2 Score 0 0 0 0 0 0 0  PHQ- 9 Score 0 0 0        Fall Risk    07/25/2022    9:35 AM 03/28/2022    9:12 AM 11/06/2021    9:18 AM 07/11/2021    3:03 PM 06/05/2020   10:50 AM  Fall Risk   Falls in the past year? 0 0 0 0 0  Number falls in past yr: 0 0 0 0   Injury with Fall? 0 0 0 0   Risk for fall due to :  No Fall Risks No Fall Risks No Fall Risks   Follow up Falls evaluation completed;Education provided;Falls prevention discussed Falls evaluation completed Falls evaluation completed Falls evaluation completed;Education provided Falls evaluation completed    MEDICARE RISK AT HOME:  Medicare Risk at Home - 07/25/22 0935     Any stairs in or around the home? No    If so, are there any without handrails? No    Home free of loose throw rugs in walkways, pet beds, electrical cords, etc? Yes    Adequate lighting in your home to reduce risk of falls? Yes    Life alert? No    Use of a cane, walker or w/c? No    Grab bars in the bathroom? Yes    Shower chair or bench in shower? No    Elevated toilet seat or a handicapped toilet? Yes             TIMED UP AND GO:  Was the test performed?  No    Cognitive Function:        07/25/2022    9:36 AM 06/05/2020   10:49 AM  6CIT Screen  What Year? 0  points 0 points  What month? 0 points 0 points  What time? 0 points 0 points  Count back from 20 0 points 0 points  Months in reverse 0 points 0 points  Repeat phrase 0 points 0 points  Total Score 0 points 0 points    Immunizations Immunization History  Administered Date(s) Administered   Fluad Quad(high Dose 65+) 10/31/2021   Influenza-Unspecified 12/04/2018   PFIZER Comirnaty(Gray Top)Covid-19 Tri-Sucrose Vaccine 04/11/2019, 05/01/2019, 11/16/2019   Pfizer Covid-19 Vaccine Bivalent Booster 70yrs & up 10/31/2021   Pneumococcal Conjugate-13 06/05/2020   Pneumococcal Polysaccharide-23 03/28/2022   Tdap 09/07/2015   Zoster Recombinat (Shingrix) 06/05/2020, 10/13/2020    TDAP status: Up to date  Flu Vaccine status: Up to date  Pneumococcal vaccine status: Up to date  Covid-19 vaccine status: Information provided on how to obtain vaccines.   Qualifies for Shingles Vaccine? No   Zostavax completed Yes   Shingrix Completed?: Yes  Screening Tests Health Maintenance  Topic Date Due   COVID-19 Vaccine (5 - 2023-24 season) 12/26/2021   INFLUENZA VACCINE  08/29/2022   Medicare Annual Wellness (AWV)  07/25/2023   DTaP/Tdap/Td (2 - Td or Tdap) 09/06/2025   Colonoscopy  11/13/2027   Pneumonia Vaccine 55+ Years old  Completed   Hepatitis C Screening  Completed   Zoster Vaccines- Shingrix  Completed   HPV VACCINES  Aged Out    Health Maintenance  Health Maintenance Due  Topic Date Due   COVID-19 Vaccine (5 - 2023-24 season) 12/26/2021    Colorectal cancer screening: Type of screening: Cologuard. Completed 2022. Repeat every 3 years  Lung Cancer Screening: (Low Dose CT Chest recommended if Age 55-80 years, 20 pack-year currently smoking OR have quit w/in 15years.) does not qualify.   Lung Cancer Screening Referral:   Additional Screening:  Hepatitis C Screening: does not qualify; Completed 2017  Vision Screening: Recommended annual ophthalmology exams for early  detection of glaucoma and other disorders of the eye. Is the patient up to date with their annual eye exam?  No  Who is the provider or what is the name of the office in which the patient attends annual eye exams?  If pt is not established with a provider, would they like to be referred to a provider to establish care? No .   Dental Screening: Recommended annual dental exams for proper oral hygiene  Community Resource Referral / Chronic Care Management: CRR required this visit?  No   CCM required this visit?  No     Plan:     I have personally reviewed and noted the following in the patient's chart:   Medical and social history Use of alcohol, tobacco or illicit drugs  Current medications and supplements including opioid prescriptions. Patient is not currently taking opioid prescriptions. Functional ability and status Nutritional status Physical activity Advanced directives List of other physicians Hospitalizations, surgeries, and ER visits in previous 12 months Vitals Screenings to include cognitive, depression, and falls Referrals and appointments  In addition, I have reviewed and discussed with patient certain preventive protocols, quality metrics, and best practice recommendations. A written personalized care plan for preventive services as well as general preventive health recommendations were provided to patient.     Shaun Haggard, LPN   1/61/0960   After Visit Summary: (MyChart) Due to this being a telephonic visit, the after visit summary with patients personalized plan was offered to patient via MyChart   Nurse Notes: Patient has started atorvastatin but when he gets to full dose he has low back pain, so he titrated down to see if that would help and it stopped, so he is going to try to titrate up again see if the back pain comes back.    He will call to schedule a 6 week check when he is consistently taking medication .

## 2022-08-28 ENCOUNTER — Other Ambulatory Visit: Payer: Self-pay | Admitting: Family Medicine

## 2022-08-28 DIAGNOSIS — R931 Abnormal findings on diagnostic imaging of heart and coronary circulation: Secondary | ICD-10-CM

## 2022-08-28 DIAGNOSIS — E785 Hyperlipidemia, unspecified: Secondary | ICD-10-CM

## 2022-09-23 ENCOUNTER — Telehealth: Payer: Self-pay | Admitting: Family Medicine

## 2022-09-23 DIAGNOSIS — E785 Hyperlipidemia, unspecified: Secondary | ICD-10-CM

## 2022-09-23 NOTE — Telephone Encounter (Signed)
Left vm to call office

## 2022-09-23 NOTE — Telephone Encounter (Signed)
Caller name: KHUSH HORR  On DPR?: Yes  Call back number: [phone] (mobile)  Provider they see: Shade Flood, MD  Reason for call:   Pt has been on Lipitor and would like to know should he come for blood work now or wait unitl his next visit March 2025

## 2022-09-23 NOTE — Telephone Encounter (Signed)
Lab visit for lipids, CMP- ordered. Please call and schedule.

## 2022-09-24 ENCOUNTER — Other Ambulatory Visit: Payer: Medicare Other

## 2022-09-24 NOTE — Telephone Encounter (Signed)
Pt is coming in tomorrow for lab only visit.

## 2022-09-25 ENCOUNTER — Other Ambulatory Visit (INDEPENDENT_AMBULATORY_CARE_PROVIDER_SITE_OTHER): Payer: Medicare Other

## 2022-09-25 DIAGNOSIS — E785 Hyperlipidemia, unspecified: Secondary | ICD-10-CM

## 2022-09-25 LAB — COMPREHENSIVE METABOLIC PANEL
ALT: 24 U/L (ref 0–53)
AST: 23 U/L (ref 0–37)
Albumin: 4.3 g/dL (ref 3.5–5.2)
Alkaline Phosphatase: 57 U/L (ref 39–117)
BUN: 18 mg/dL (ref 6–23)
CO2: 31 mEq/L (ref 19–32)
Calcium: 9.4 mg/dL (ref 8.4–10.5)
Chloride: 103 mEq/L (ref 96–112)
Creatinine, Ser: 0.96 mg/dL (ref 0.40–1.50)
GFR: 81.27 mL/min (ref >60.00)
Glucose, Bld: 95 mg/dL (ref 70–99)
Potassium: 4.3 mEq/L (ref 3.5–5.1)
Sodium: 140 mEq/L (ref 135–145)
Total Bilirubin: 1.3 mg/dL — ABNORMAL HIGH (ref 0.2–1.2)
Total Protein: 6.8 g/dL (ref 6.0–8.3)

## 2022-09-25 LAB — LIPID PANEL
Cholesterol: 107 mg/dL (ref 0–200)
HDL: 37.2 mg/dL — ABNORMAL LOW (ref >39.00)
LDL Cholesterol: 55 mg/dL (ref 0–99)
NonHDL: 69.77
Total CHOL/HDL Ratio: 3
Triglycerides: 74 mg/dL (ref 0.0–149.0)
VLDL: 14.8 mg/dL (ref 0.0–40.0)

## 2022-09-25 NOTE — Progress Notes (Signed)
Pt came for labs only, tolerated draw well.   

## 2022-11-07 IMAGING — MR MR PROSTATE WO/W CM
13 series · 48 of 48 positions shown · IV contrast (multihance)
Comparison: None.

CLINICAL DATA: Elevated PSA equal 4.7. 65-year-old male. Prostate
biopsy 02/24/2019. negative prostate biopsy

EXAM:
MR PROSTATE WITHOUT AND WITH CONTRAST
TECHNIQUE: Multiplanar multisequence MRI images were obtained of the pelvis
centered about the prostate. Pre and post contrast images were
obtained.
CONTRAST:  15mL MULTIHANCE GADOBENATE DIMEGLUMINE 529 MG/ML IV SOLN

[Series 3: T2 · coronal · 3.0mm · 0.56mm/px · 1 of 23 slices shown (1 of 4)]
[im 1/23]
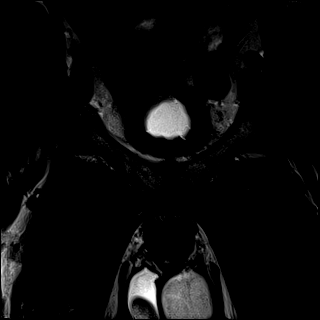

[Series 4: T1 · axial · 5.0mm · 1.25mm/px · 1 of 88 slices shown]
[im 1/88]
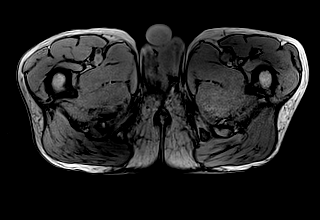

[Series 5: DWI · axial · 3.0mm · 1.75mm/px · z∈[-87,-3]mm · 2 of 87 slices shown (1 of 3)]
[im 1/87]
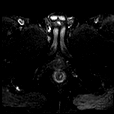
[im 87/87]
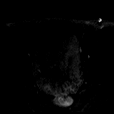

[Series 6: DWI · axial · 3.0mm · 1.75mm/px · 1 of 29 slices shown (2 of 3)]
[im 1/29]
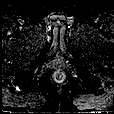

[Series 7: DWI · axial · 3.0mm · 1.75mm/px · 1 of 29 slices shown (3 of 3)]
[im 1/29]
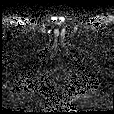

[Series 8: T2 · coronal · 3.0mm · 0.56mm/px · 1 of 23 slices shown (2 of 4)]
[im 1/23]
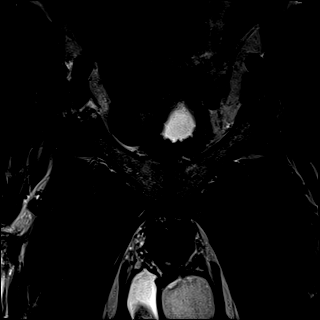

[Series 9: T2 · axial · 3.0mm · 0.56mm/px · 1 of 29 slices shown (3 of 4)]
[im 1/29]
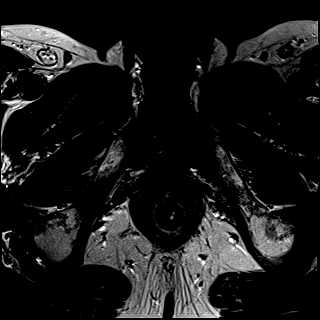

[Series 10: T2 · axial · 1.0mm · 1.04mm/px · z∈[-81,-2]mm · 2 of 80 slices shown (4 of 4)]
[im 1/80]
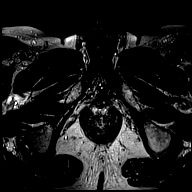
[im 80/80]
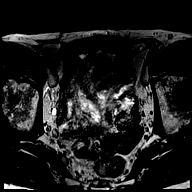

[Series 11: pre t1_twist_tra_dyn · axial · non-contrast · 3.5mm · 0.83mm/px · 1 of 22 slices shown]
[im 1/22]
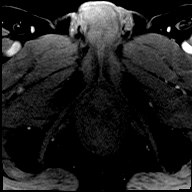

[Series 12: post t1_twist_tra_dyn-copy center · axial · non-contrast · 3.5mm · 0.83mm/px · z∈[-81,-8]mm · 17 of 660 slices shown]
[im 1/660]
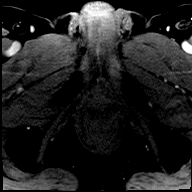
[im 42/660]
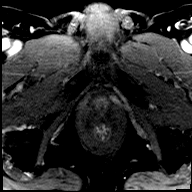
[im 83/660]
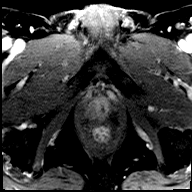
[im 124/660]
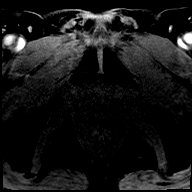
[im 165/660]
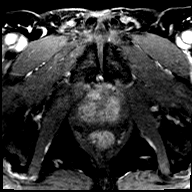
[im 206/660]
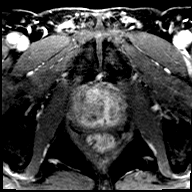
[im 248/660]
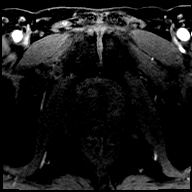
[im 289/660]
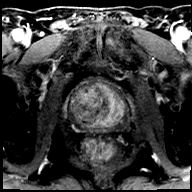
[im 330/660]
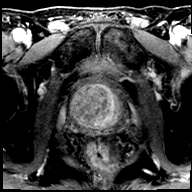
[im 371/660]
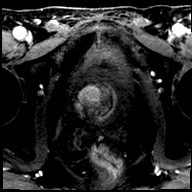
[im 412/660]
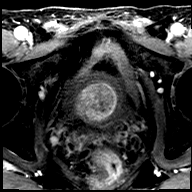
[im 454/660]
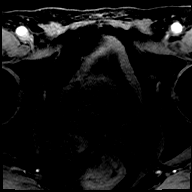
[im 495/660]
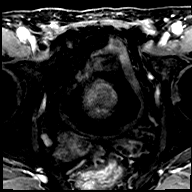
[im 536/660]
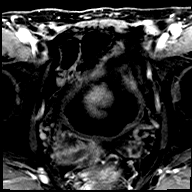
[im 577/660]
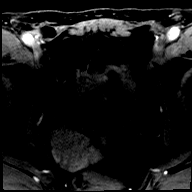
[im 618/660]
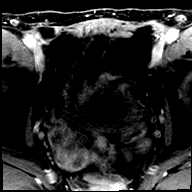
[im 660/660]
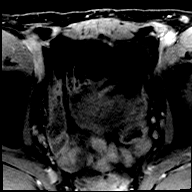

[Series 13: post t1_twist_tra_dyn-copy cent_sub · axial · 3.5mm · 0.83mm/px · z∈[-81,-8]mm · 16 of 638 slices shown]
[im 1/638]
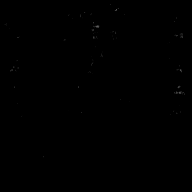
[im 43/638]
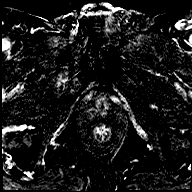
[im 85/638]
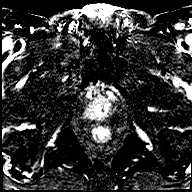
[im 128/638]
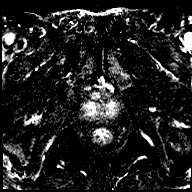
[im 170/638]
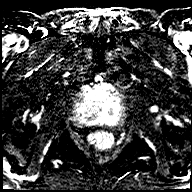
[im 213/638]
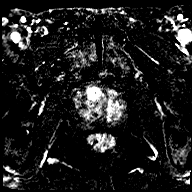
[im 255/638]
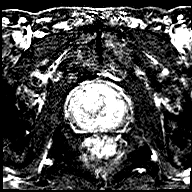
[im 298/638]
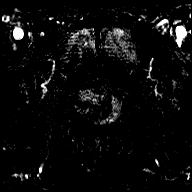
[im 340/638]
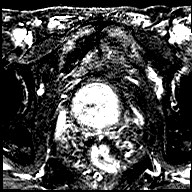
[im 383/638]
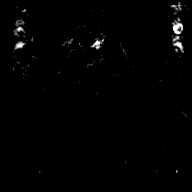
[im 425/638]
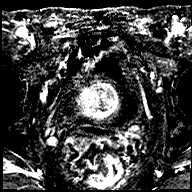
[im 468/638]
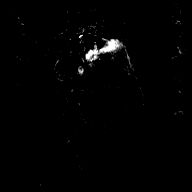
[im 510/638]
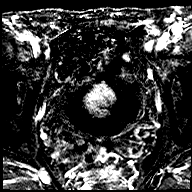
[im 553/638]
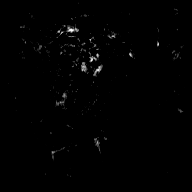
[im 595/638]
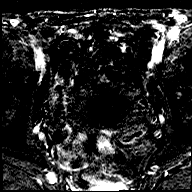
[im 638/638]
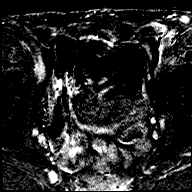

[Series 14: t1_vibe_dixon_tra_f · axial · 2.5mm · 0.91mm/px · z∈[-103,+95]mm · 2 of 80 slices shown]
[im 1/80]
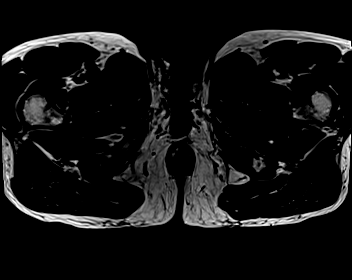
[im 80/80]
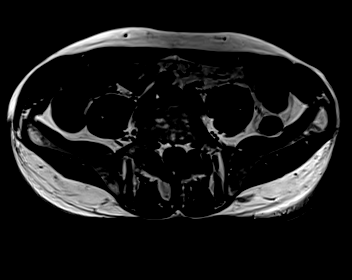

[Series 15: t1_vibe_dixon_tra_w · axial · 2.5mm · 0.91mm/px · z∈[-103,+95]mm · 2 of 80 slices shown]
[im 1/80]
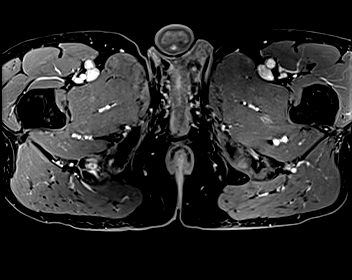
[im 80/80]
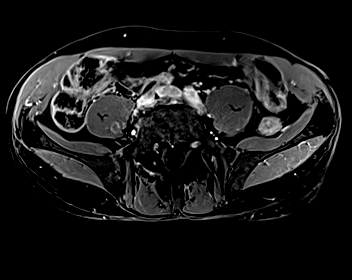

[48 of 48 positions shown; findings below may reference images not displayed]

FINDINGS: Prostate: No foci of restricted diffusion within the peripheral zone
(series 6).

The peripheral zone has normal high signal intensity on T2 weighted
imaging without focal lesion (series 9).

The transitional zone is enlarged by well capsulated nodules without
suspicious imaging features on T2 weighted imaging.

No suspicious enhancement pattern on postcontrast imaging

Prostatic capsule intact.  Seminal vesicles normal.

Volume: 5.3 x 5.0 x 3.7 cm (volume = 51 cm^3)

Transcapsular spread:  Absent

Seminal vesicle involvement: Absent

Neurovascular bundle involvement: Absent

Pelvic adenopathy: Absent

Bone metastasis: Absent

Other findings: None
IMPRESSION: 1. No high-grade carcinoma in the peripheral zone.  PI-RADS: 1.
2. Enlarged nodule transitional zone most consistent with benign
prostate hypertrophy. PI-RADS: 2

## 2022-11-24 ENCOUNTER — Other Ambulatory Visit: Payer: Self-pay | Admitting: Family Medicine

## 2022-11-24 DIAGNOSIS — E785 Hyperlipidemia, unspecified: Secondary | ICD-10-CM

## 2022-11-24 DIAGNOSIS — R931 Abnormal findings on diagnostic imaging of heart and coronary circulation: Secondary | ICD-10-CM

## 2022-11-27 ENCOUNTER — Other Ambulatory Visit: Payer: Self-pay | Admitting: Family Medicine

## 2022-11-27 DIAGNOSIS — E785 Hyperlipidemia, unspecified: Secondary | ICD-10-CM

## 2022-11-27 DIAGNOSIS — R931 Abnormal findings on diagnostic imaging of heart and coronary circulation: Secondary | ICD-10-CM

## 2023-01-09 DIAGNOSIS — X32XXXD Exposure to sunlight, subsequent encounter: Secondary | ICD-10-CM | POA: Diagnosis not present

## 2023-01-09 DIAGNOSIS — L570 Actinic keratosis: Secondary | ICD-10-CM | POA: Diagnosis not present

## 2023-04-02 ENCOUNTER — Encounter: Payer: Self-pay | Admitting: Family Medicine

## 2023-04-02 ENCOUNTER — Ambulatory Visit (INDEPENDENT_AMBULATORY_CARE_PROVIDER_SITE_OTHER): Payer: Medicare Other | Admitting: Family Medicine

## 2023-04-02 VITALS — BP 128/64 | HR 77 | Temp 98.0°F | Ht 70.75 in | Wt 155.0 lb

## 2023-04-02 DIAGNOSIS — E785 Hyperlipidemia, unspecified: Secondary | ICD-10-CM | POA: Diagnosis not present

## 2023-04-02 DIAGNOSIS — Z0000 Encounter for general adult medical examination without abnormal findings: Secondary | ICD-10-CM

## 2023-04-02 MED ORDER — PRAVASTATIN SODIUM 20 MG PO TABS: 20.0000 mg | ORAL_TABLET | Freq: Every day | ORAL | 1 refills | Status: DC

## 2023-04-02 NOTE — Patient Instructions (Addendum)
 Try changing the lipitor to pravastatin once per day, with repeat labs in 6 weeks. That can be a lab only visit.   I do recommend updated eye specialist appointment, as well as dentist.   Thank you for coming in today.  If there are any questions please let me know.  Preventive Care 35 Years and Older, Male Preventive care refers to lifestyle choices and visits with your health care provider that can promote health and wellness. Preventive care visits are also called wellness exams. What can I expect for my preventive care visit? Counseling During your preventive care visit, your health care provider may ask about your: Medical history, including: Past medical problems. Family medical history. History of falls. Current health, including: Emotional well-being. Home life and relationship well-being. Sexual activity. Memory and ability to understand (cognition). Lifestyle, including: Alcohol, nicotine or tobacco, and drug use. Access to firearms. Diet, exercise, and sleep habits. Work and work Astronomer. Sunscreen use. Safety issues such as seatbelt and bike helmet use. Physical exam Your health care provider will check your: Height and weight. These may be used to calculate your BMI (body mass index). BMI is a measurement that tells if you are at a healthy weight. Waist circumference. This measures the distance around your waistline. This measurement also tells if you are at a healthy weight and may help predict your risk of certain diseases, such as type 2 diabetes and high blood pressure. Heart rate and blood pressure. Body temperature. Skin for abnormal spots. What immunizations do I need?  Vaccines are usually given at various ages, according to a schedule. Your health care provider will recommend vaccines for you based on your age, medical history, and lifestyle or other factors, such as travel or where you work. What tests do I need? Screening Your health care provider  may recommend screening tests for certain conditions. This may include: Lipid and cholesterol levels. Diabetes screening. This is done by checking your blood sugar (glucose) after you have not eaten for a while (fasting). Hepatitis C test. Hepatitis B test. HIV (human immunodeficiency virus) test. STI (sexually transmitted infection) testing, if you are at risk. Lung cancer screening. Colorectal cancer screening. Prostate cancer screening. Abdominal aortic aneurysm (AAA) screening. You may need this if you are a current or former smoker. Talk with your health care provider about your test results, treatment options, and if necessary, the need for more tests. Follow these instructions at home: Eating and drinking  Eat a diet that includes fresh fruits and vegetables, whole grains, lean protein, and low-fat dairy products. Limit your intake of foods with high amounts of sugar, saturated fats, and salt. Take vitamin and mineral supplements as recommended by your health care provider. Do not drink alcohol if your health care provider tells you not to drink. If you drink alcohol: Limit how much you have to 0-2 drinks a day. Know how much alcohol is in your drink. In the [place] one drink equals one 12 oz bottle of beer (355 mL), one 5 oz glass of wine (148 mL), or one 1 oz glass of hard liquor (44 mL). Lifestyle Brush your teeth every morning and night with fluoride toothpaste. Floss one time each day. Exercise for at least 30 minutes 5 or more days each week. Do not use any products that contain nicotine or tobacco. These products include cigarettes, chewing tobacco, and vaping devices, such as e-cigarettes. If you need help quitting, ask your health care provider. Do not use drugs. If you  are sexually active, practice safe sex. Use a condom or other form of protection to prevent STIs. Take aspirin only as told by your health care provider. Make sure that you understand how much to take and  what form to take. Work with your health care provider to find out whether it is safe and beneficial for you to take aspirin daily. Ask your health care provider if you need to take a cholesterol-lowering medicine (statin). Find healthy ways to manage stress, such as: Meditation, yoga, or listening to music. Journaling. Talking to a trusted person. Spending time with friends and family. Safety Always wear your seat belt while driving or riding in a vehicle. Do not drive: If you have been drinking alcohol. Do not ride with someone who has been drinking. When you are tired or distracted. While texting. If you have been using any mind-altering substances or drugs. Wear a helmet and other protective equipment during sports activities. If you have firearms in your house, make sure you follow all gun safety procedures. Minimize exposure to UV radiation to reduce your risk of skin cancer. What's next? Visit your health care provider once a year for an annual wellness visit. Ask your health care provider how often you should have your eyes and teeth checked. Stay up to date on all vaccines. This information is not intended to replace advice given to you by your health care provider. Make sure you discuss any questions you have with your health care provider. Document Revised: 07/12/2020 Document Reviewed: 07/12/2020 Elsevier Patient Education  2024 [employer].

## 2023-04-02 NOTE — Progress Notes (Signed)
 Subjective:  Patient ID: Shaun Lee, male    DOB: 1956-01-06  Age: 68 y.o. MRN: [id number]  CC:  Chief Complaint  Patient presents with   Annual Exam    Patient had labs done with insurance and would like you to review them.     Joint Pain    Pt reports back and knee pain while taking Lipitor.     HPI Shaun Lee presents for Annual Exam PCP, [facility], Dr. Emily Filbert.  History of skin cancer. Now patient of Dr. Margo Aye, followed by APP in that office. Appt in 12/2022. No changes.  Urology, Dr. Annabell Howells, BPH with LUTS status post TURP in December 2023  Asthma, mild intermittent Albuterol as needed, symptoms improved with retirement, previous few years. No recent need for albuterol.   Allergic rhinitis Previously treated with Nasacort, Allegra as needed. Not yet needed.   Hyperlipidemia: Coronary calcium scoring 04/30/22, score of 140, 53 percentile, left main and left anterior descending arteries, aortic atherosclerosis.  Atorvastatin was ordered with co-Q10 as needed.  LDL significantly improved in August, with reading of 55 down from 115 previously.  Unfortunately he did have some myalgias/arthralgias with statin.  Back and knee pain with daily dosing. Stopped meds, pain resolved. Better with biking. Tolerated 1/2 pill per day initially, worked up to full pill per day past month, then some return of arthralgias, but mild. Tried stretches. Some soreness at end of day.   Lab Results  Component Value Date   CHOL 107 09/25/2022   HDL 37.20 (L) 09/25/2022   LDLCALC 55 09/25/2022   TRIG 74.0 09/25/2022   CHOLHDL 3 09/25/2022   Lab Results  Component Value Date   ALT 24 09/25/2022   AST 23 09/25/2022   ALKPHOS 57 09/25/2022   BILITOT 1.3 (H) 09/25/2022   As above had labs with insurance for review today:  Lab report from 03/28/2023.  Fasting labs.  Total cholesterol 130, HDL 43, LDL 71 triglycerides 80.  Creatinine ratio 0.05 on urine testing.  Hepatic function panel normal with  AST 22, ALT 28.  Hep C antibody negative.  HIV negative.  PSA 2.89.  Glucose 97, A1c 5.4.  proBNP 125.  Creatinine 0.9.  Urine negative for hemoglobin.  Denies CP/dyspnea, or change in activity. No hx of CHF.       04/02/2023    8:38 AM 07/25/2022    9:39 AM 03/28/2022    9:12 AM 11/06/2021    9:17 AM 07/11/2021    3:03 PM  Depression screen PHQ 2/9  Decreased Interest 0 0 0 0 0  Down, Depressed, Hopeless 0 0 0 0 0  PHQ - 2 Score 0 0 0 0 0  Altered sleeping 0 0 0 0   Tired, decreased energy 0 0 0 0   Change in appetite 0 0 0 0   Feeling bad or failure about yourself  0 0 0 0   Trouble concentrating 0 0 0 0   Moving slowly or fidgety/restless 0 0 0 0   Suicidal thoughts 0 0 0 0   PHQ-9 Score 0 0 0 0   Difficult doing work/chores  Not difficult at all Not difficult at all      Health Maintenance  Topic Date Due   Medicare Annual Wellness (AWV)  07/25/2023   DTaP/Tdap/Td (2 - Td or Tdap) 09/06/2025   Colonoscopy  11/13/2027   Pneumonia Vaccine 72+ Years old  Completed   INFLUENZA VACCINE  Completed  COVID-19 Vaccine  Completed   Hepatitis C Screening  Completed   Zoster Vaccines- Shingrix  Completed   HPV VACCINES  Aged Out  Cologuard negative in October 2022 Prostate: Followed by urology with BPH, LUTS as above status post TURP.Urology note April 2024.  Continued on sildenafil as needed for erectile dysfunction.  PSA last year with urology and planned on repeat testing again at 1 year. Appt in May.    Immunization History  Administered Date(s) Administered   Fluad Quad(high Dose 65+) 10/31/2021   Influenza-Unspecified 12/04/2018, 11/13/2022   PFIZER Comirnaty(Gray Top)Covid-19 Tri-Sucrose Vaccine 04/11/2019, 05/01/2019, 11/16/2019   Pfizer Covid-19 Vaccine Bivalent Booster 65yrs & up 10/31/2021   Pneumococcal Conjugate-13 06/05/2020   Pneumococcal Polysaccharide-23 03/28/2022   Tdap 09/07/2015   Unspecified SARS-COV-2 Vaccination 11/13/2022   Zoster Recombinant(Shingrix)  06/05/2020, 10/13/2020  UTD on covid and flu vaccine.   No results found. Readers for glasses. Last visit 4 yrs ago.   Dental: overdue. Plans to schedule  Alcohol: rare- less than a few beers per month, none this year   Tobacco: none.   Exercise: walking, biking. Over 150 mins per week. 1.5hrs, 5x/week.    History Patient Active Problem List   Diagnosis Date Noted   BPH with obstruction/lower urinary tract symptoms 01/24/2022   History of skin cancer 11/06/2021   Lentigo 11/06/2021   Asthma 05/13/2018   Allergic rhinitis 05/13/2018   Inguinal hernia, right 07/28/2013   Spermatocele of epididymis, single 07/28/2013   Past Medical History:  Diagnosis Date   Allergy    Asthma    Cancer (HCC)    skin cancer   Past Surgical History:  Procedure Laterality Date   HERNIA REPAIR     TRANSURETHRAL RESECTION OF PROSTATE N/A 01/24/2022   Procedure: TRANSURETHRAL RESECTION OF THE PROSTATE (TURP);  Surgeon: Bjorn Pippin, MD;  Location: [facility];  Service: Urology;  Laterality: N/A;   No Known Allergies Prior to Admission medications   Medication Sig Start Date End Date Taking? Authorizing Provider  albuterol (VENTOLIN HFA) 108 (90 Base) MCG/ACT inhaler INHALE 2 PUFFS INTO THE LUNGS EVERY 4 (FOUR) HOURS AS NEEDED FOR WHEEZING OR SHORTNESS OF BREATH (COUGH, SHORTNESS OF BREATH OR WHEEZING.). 03/30/19  Yes Shade Flood, MD  atorvastatin (LIPITOR) 10 MG tablet TAKE 1 TABLET(10 MG) BY MOUTH DAILY 11/25/22  Yes Shade Flood, MD  fexofenadine (ALLEGRA) 180 MG tablet Take 180 mg by mouth daily as needed for allergies or rhinitis.   Yes [provider]  ibuprofen (ADVIL) 200 MG tablet Take 400 mg by mouth every 6 (six) hours as needed for moderate pain.   Yes [provider]  sildenafil (VIAGRA) 100 MG tablet Take 50-100 mg by mouth daily as needed for erectile dysfunction. 12/24/21  Yes [provider]  triamcinolone (NASACORT ALLERGY 24HR) 55 MCG/ACT AERO  nasal inhaler Place 2 sprays into the nose daily as needed (allergies).   Yes [provider]   Social History   Socioeconomic History   Marital status: Married    Spouse name: Not on file   Number of children: 1   Years of education: Not on file   Highest education level: 12th grade  Occupational History   Not on file  Tobacco Use   Smoking status: Never   Smokeless tobacco: Never  Vaping Use   Vaping status: Never Used  Substance and Sexual Activity   Alcohol use: Yes    Comment: rare occasion    Drug use: No  Sexual activity: Not Currently  Other Topics Concern   Not on file  Social History Narrative   Not on file   Social Drivers of Health   Financial Resource Strain: Low Risk  (03/27/2023)   Overall Financial Resource Strain (CARDIA)    Difficulty of Paying Living Expenses: Not hard at all  Food Insecurity: No Food Insecurity (03/27/2023)   Hunger Vital Sign    Worried About Running Out of Food in the Last Year: Never true    Ran Out of Food in the Last Year: Never true  Transportation Needs: No Transportation Needs (03/27/2023)   PRAPARE - Administrator, Civil Service (Medical): No    Lack of Transportation (Non-Medical): No  Physical Activity: Sufficiently Active (03/27/2023)   Exercise Vital Sign    Days of Exercise per Week: 5 days    Minutes of Exercise per Session: 90 min  Stress: No Stress Concern Present (03/27/2023)   [employer] of Occupational Health - Occupational Stress Questionnaire    Feeling of Stress : Not at all  Social Connections: Socially Integrated (03/27/2023)   Social Connection and Isolation Panel [NHANES]    Frequency of Communication with Friends and Family: Twice a week    Frequency of Social Gatherings with Friends and Family: More than three times a week    Attends Religious Services: More than 4 times per year    Active Member of [employer] or Organizations: Yes    Attends Banker Meetings: More  than 4 times per year    Marital Status: Married  Catering manager Violence: Not At Risk (07/25/2022)   Humiliation, Afraid, Rape, and Kick questionnaire    Fear of Current or Ex-Partner: No    Emotionally Abused: No    Physically Abused: No    Sexually Abused: No    Review of Systems 13 point review of systems per patient health survey noted.  Negative other than as indicated above or in HPI.   Objective:   Vitals:   04/02/23 0835  BP: 128/64  Pulse: 77  Temp: 98 F (36.7 C)  TempSrc: Temporal  SpO2: 97%  Weight: 155 lb (70.3 kg)  Height: 5' 10.75" (1.797 m)     Physical Exam Vitals reviewed.        Assessment & Plan:  Shaun Lee is a 68 y.o. male . Annual physical exam  - -anticipatory guidance as below in AVS, screening labs above. Health maintenance items as above in HPI discussed/recommended as applicable.   Hyperlipidemia, unspecified hyperlipidemia type - Plan: Comprehensive metabolic panel, Lipid panel  -Mild arthralgias on atorvastatin.  Significant improvement of LDL with medication.  May be able to tolerate milder statin.  Will try pravastatin 20 mg, lab visit in 6 weeks for lipids, CMP, and then can decide on higher dosing if tolerated.  Recent lab work as above reviewed.  Borderline proBNP but asymptomatic and at that level I do not think it is clinically significant.  RTC precautions.  6-week recheck for lab only visit, 78-month follow-up for med review.  Keep follow-up with specialist including urology as planned.  Meds ordered this encounter  Medications   pravastatin (PRAVACHOL) 20 MG tablet    Sig: Take 1 tablet (20 mg total) by mouth daily.    Dispense:  90 tablet    Refill:  1   Patient Instructions  Try changing the lipitor to pravastatin once per day, with repeat labs in 6 weeks. That can be a lab  only visit.   I do recommend updated eye specialist appointment, as well as dentist.   Thank you for coming in today.  If there are any  questions please let me know.  Preventive Care 78 Years and Older, Male Preventive care refers to lifestyle choices and visits with your health care provider that can promote health and wellness. Preventive care visits are also called wellness exams. What can I expect for my preventive care visit? Counseling During your preventive care visit, your health care provider may ask about your: Medical history, including: Past medical problems. Family medical history. History of falls. Current health, including: Emotional well-being. Home life and relationship well-being. Sexual activity. Memory and ability to understand (cognition). Lifestyle, including: Alcohol, nicotine or tobacco, and drug use. Access to firearms. Diet, exercise, and sleep habits. Work and work Astronomer. Sunscreen use. Safety issues such as seatbelt and bike helmet use. Physical exam Your health care provider will check your: Height and weight. These may be used to calculate your BMI (body mass index). BMI is a measurement that tells if you are at a healthy weight. Waist circumference. This measures the distance around your waistline. This measurement also tells if you are at a healthy weight and may help predict your risk of certain diseases, such as type 2 diabetes and high blood pressure. Heart rate and blood pressure. Body temperature. Skin for abnormal spots. What immunizations do I need?  Vaccines are usually given at various ages, according to a schedule. Your health care provider will recommend vaccines for you based on your age, medical history, and lifestyle or other factors, such as travel or where you work. What tests do I need? Screening Your health care provider may recommend screening tests for certain conditions. This may include: Lipid and cholesterol levels. Diabetes screening. This is done by checking your blood sugar (glucose) after you have not eaten for a while (fasting). Hepatitis C  test. Hepatitis B test. HIV (human immunodeficiency virus) test. STI (sexually transmitted infection) testing, if you are at risk. Lung cancer screening. Colorectal cancer screening. Prostate cancer screening. Abdominal aortic aneurysm (AAA) screening. You may need this if you are a current or former smoker. Talk with your health care provider about your test results, treatment options, and if necessary, the need for more tests. Follow these instructions at home: Eating and drinking  Eat a diet that includes fresh fruits and vegetables, whole grains, lean protein, and low-fat dairy products. Limit your intake of foods with high amounts of sugar, saturated fats, and salt. Take vitamin and mineral supplements as recommended by your health care provider. Do not drink alcohol if your health care provider tells you not to drink. If you drink alcohol: Limit how much you have to 0-2 drinks a day. Know how much alcohol is in your drink. In the [place] one drink equals one 12 oz bottle of beer (355 mL), one 5 oz glass of wine (148 mL), or one 1 oz glass of hard liquor (44 mL). Lifestyle Brush your teeth every morning and night with fluoride toothpaste. Floss one time each day. Exercise for at least 30 minutes 5 or more days each week. Do not use any products that contain nicotine or tobacco. These products include cigarettes, chewing tobacco, and vaping devices, such as e-cigarettes. If you need help quitting, ask your health care provider. Do not use drugs. If you are sexually active, practice safe sex. Use a condom or other form of protection to prevent STIs. Take aspirin  only as told by your health care provider. Make sure that you understand how much to take and what form to take. Work with your health care provider to find out whether it is safe and beneficial for you to take aspirin daily. Ask your health care provider if you need to take a cholesterol-lowering medicine (statin). Find healthy  ways to manage stress, such as: Meditation, yoga, or listening to music. Journaling. Talking to a trusted person. Spending time with friends and family. Safety Always wear your seat belt while driving or riding in a vehicle. Do not drive: If you have been drinking alcohol. Do not ride with someone who has been drinking. When you are tired or distracted. While texting. If you have been using any mind-altering substances or drugs. Wear a helmet and other protective equipment during sports activities. If you have firearms in your house, make sure you follow all gun safety procedures. Minimize exposure to UV radiation to reduce your risk of skin cancer. What's next? Visit your health care provider once a year for an annual wellness visit. Ask your health care provider how often you should have your eyes and teeth checked. Stay up to date on all vaccines. This information is not intended to replace advice given to you by your health care provider. Make sure you discuss any questions you have with your health care provider. Document Revised: 07/12/2020 Document Reviewed: 07/12/2020 Elsevier Patient Education  2024 [employer]       Signed,   Meredith Staggers, MD [facility], [facility] 04/02/23 9:24 AM

## 2023-05-01 DIAGNOSIS — D225 Melanocytic nevi of trunk: Secondary | ICD-10-CM | POA: Diagnosis not present

## 2023-05-01 DIAGNOSIS — Z1283 Encounter for screening for malignant neoplasm of skin: Secondary | ICD-10-CM | POA: Diagnosis not present

## 2023-05-14 ENCOUNTER — Other Ambulatory Visit (INDEPENDENT_AMBULATORY_CARE_PROVIDER_SITE_OTHER)

## 2023-05-14 DIAGNOSIS — E785 Hyperlipidemia, unspecified: Secondary | ICD-10-CM

## 2023-05-14 LAB — COMPREHENSIVE METABOLIC PANEL WITH GFR
ALT: 24 U/L (ref 0–53)
AST: 25 U/L (ref 0–37)
Albumin: 4.9 g/dL (ref 3.5–5.2)
Alkaline Phosphatase: 57 U/L (ref 39–117)
BUN: 18 mg/dL (ref 6–23)
CO2: 30 meq/L (ref 19–32)
Calcium: 9.8 mg/dL (ref 8.4–10.5)
Chloride: 100 meq/L (ref 96–112)
Creatinine, Ser: 0.98 mg/dL (ref 0.40–1.50)
GFR: 78.93 mL/min (ref >60.00)
Glucose, Bld: 95 mg/dL (ref 70–99)
Potassium: 4.4 meq/L (ref 3.5–5.1)
Sodium: 138 meq/L (ref 135–145)
Total Bilirubin: 1 mg/dL (ref 0.2–1.2)
Total Protein: 7.3 g/dL (ref 6.0–8.3)

## 2023-05-14 LAB — LIPID PANEL
Cholesterol: 134 mg/dL (ref 0–200)
HDL: 45.5 mg/dL (ref >39.00)
LDL Cholesterol: 74 mg/dL (ref 0–99)
NonHDL: 88.37
Total CHOL/HDL Ratio: 3
Triglycerides: 73 mg/dL (ref 0.0–149.0)
VLDL: 14.6 mg/dL (ref 0.0–40.0)

## 2023-05-15 ENCOUNTER — Encounter: Payer: Self-pay | Admitting: Family Medicine

## 2023-05-23 DIAGNOSIS — R972 Elevated prostate specific antigen [PSA]: Secondary | ICD-10-CM | POA: Diagnosis not present

## 2023-05-30 DIAGNOSIS — N403 Nodular prostate with lower urinary tract symptoms: Secondary | ICD-10-CM | POA: Diagnosis not present

## 2023-05-30 DIAGNOSIS — N5201 Erectile dysfunction due to arterial insufficiency: Secondary | ICD-10-CM | POA: Diagnosis not present

## 2023-05-30 DIAGNOSIS — R972 Elevated prostate specific antigen [PSA]: Secondary | ICD-10-CM | POA: Diagnosis not present

## 2023-05-30 DIAGNOSIS — R350 Frequency of micturition: Secondary | ICD-10-CM | POA: Diagnosis not present

## 2023-09-10 ENCOUNTER — Ambulatory Visit: Payer: Self-pay | Admitting: *Deleted

## 2023-09-10 NOTE — Telephone Encounter (Signed)
 FYI Only or Action Required?: FYI only for provider.  Patient was last seen in primary care on 04/02/2023 by Levora Reyes SAUNDERS, MD.  Called Nurse Triage reporting chest congestion (Body aches, cough, feverish-chills).  Symptoms began a week ago.  Interventions attempted: Rest, hydration, or home remedies.  Symptoms are: gradually worsening.  Triage Disposition: See Physician Within 24 Hours  Patient/caregiver understands and will follow disposition?: Yes- patient requested afternoon appointment- can not come tomorrow am    Reason for Disposition  Fever present > 3 days (72 hours)  Answer Assessment - Initial Assessment Questions 1. ONSET: When did the muscle aches or body pains start?      2 weeks 2. LOCATION: What part of your body is hurting? (e.g., entire body, arms, legs)      All over- neck, shoulder, lower back, legs 3. SEVERITY: How bad is the pain? (Scale 1-10; or mild, moderate, severe)     Mild- bothersome 4. CAUSE: What do you think is causing the pains?     illness 5. FEVER: Do you have a fever? If Yes, ask: What is your temperature, how was it measured, and  when did it start?      Chills- not checked 6. OTHER SYMPTOMS: Do you have any other symptoms? (e.g., chest pain, cold or flu symptoms, rash, weakness, weight loss)     Chest congestion- cough- tan in color, nasal congestion  8. TRAVEL: Have you traveled out of the country in the last month? (e.g., exposures, travel history)     COVID test negative, grandson- had been sick  Offered early am appointment tomorrow- patient is unable to come- scheduled afternoon appointment per request  Protocols used: Muscle Aches and Body Pain-A-AH   Copied from CRM #[id number]. Topic: Clinical - Red Word Triage >> Sep 10, 2023  8:09 AM [facility] ORN wrote: Red Word that prompted transfer to Nurse Triage: Fever / Chills >> Sep 10, 2023  8:11 AM [facility] ORN wrote: Patient states he's been experiencing a fever as  well as chills for about a week 1/2. Patient also mention lung congestion. Patient states he had a cold about 2 weeks ago, he felt like symptoms were getting better but then they'll come right back. Patient also has asthma.

## 2023-09-11 ENCOUNTER — Ambulatory Visit (INDEPENDENT_AMBULATORY_CARE_PROVIDER_SITE_OTHER): Admitting: Family Medicine

## 2023-09-11 ENCOUNTER — Encounter: Payer: Self-pay | Admitting: Family Medicine

## 2023-09-11 VITALS — BP 130/50 | HR 73 | Temp 98.8°F | Wt 153.0 lb

## 2023-09-11 DIAGNOSIS — J4520 Mild intermittent asthma, uncomplicated: Secondary | ICD-10-CM | POA: Diagnosis not present

## 2023-09-11 DIAGNOSIS — J40 Bronchitis, not specified as acute or chronic: Secondary | ICD-10-CM

## 2023-09-11 DIAGNOSIS — J329 Chronic sinusitis, unspecified: Secondary | ICD-10-CM | POA: Diagnosis not present

## 2023-09-11 MED ORDER — PREDNISONE 20 MG PO TABS: 40.0000 mg | ORAL_TABLET | Freq: Every day | ORAL | 0 refills | Status: DC

## 2023-09-11 MED ORDER — AMOXICILLIN-POT CLAVULANATE 875-125 MG PO TABS: 1.0000 | ORAL_TABLET | Freq: Two times a day (BID) | ORAL | 0 refills | Status: DC

## 2023-09-11 NOTE — Patient Instructions (Signed)
 Exam was reassuring today.  If you are not continuing to improve tomorrow start the antibiotic Augmentin .  If any persistent fevers chills, or worsening symptoms please be seen.  If you do require albuterol  frequently again or worsening wheezing I also prescribed prednisone .   Return to the clinic or go to the nearest emergency room if any of your symptoms worsen or new symptoms occur.

## 2023-09-11 NOTE — Progress Notes (Signed)
 Subjective:  Patient ID: Shaun Lee, male    DOB: [birth year]  Age: 68 y.o. MRN: [id number]  CC:  Chief Complaint  Patient presents with   Chills    Couple weeks ago cold started and symptoms were mild 2-3 days started to feel better but then started to get sinus congestion and bronchitis with fever and body aches.     HPI JAMIESON HETLAND presents for   Cold symptoms 2 weeks ago - sore throat, sneezing, runny nose. Slight nasal congestion. Was improving after a few days, then worsened with more chest and nasal congestion, body aches, feverish, sinus pain. Started to improve then worse last weekend. More chest congestion, subjective fever, chills. Ibuprofen at night. Night sweats last week. Still intermittent night sweats. Achy yesterday, chills yesterday. No photo/phonophobia. Slight soreness in neck, mild HA.  Covid test last weekend.  Productive cough yesterday - some improvement today. Same with nasal discharge, yellow green nasal d/c and phlegm. Face pain/sinus pain slightly improved. Eating drinking ok. No chest pain, no confusion. Staying hydrated.  Hx of asthma - has required albuterol  2-3 times per day, not yet needed today.   Not using nasocort, allegra.  Sick contact - grandson with mild cold prior to his symptoms.   Tx: sudafed.   History Patient Active Problem List   Diagnosis Date Noted   BPH with obstruction/lower urinary tract symptoms 01/24/2022   History of skin cancer 11/06/2021   Lentigo 11/06/2021   Asthma 05/13/2018   Allergic rhinitis 05/13/2018   Inguinal hernia, right 07/28/2013   Spermatocele of epididymis, single 07/28/2013   Past Medical History:  Diagnosis Date   Allergy    Asthma    Cancer (HCC)    skin cancer   Past Surgical History:  Procedure Laterality Date   HERNIA REPAIR     TRANSURETHRAL RESECTION OF PROSTATE N/A 01/24/2022   Procedure: TRANSURETHRAL RESECTION OF THE PROSTATE (TURP);  Surgeon: Watt Rush, MD;  Location: [facility];   Service: Urology;  Laterality: N/A;   No Known Allergies Prior to Admission medications   Medication Sig Start Date End Date Taking? Authorizing Provider  albuterol  (VENTOLIN  HFA) 108 (90 Base) MCG/ACT inhaler INHALE 2 PUFFS INTO THE LUNGS EVERY 4 (FOUR) HOURS AS NEEDED FOR WHEEZING OR SHORTNESS OF BREATH (COUGH, SHORTNESS OF BREATH OR WHEEZING.). 03/30/19  Yes Levora Reyes SAUNDERS, MD  fexofenadine (ALLEGRA) 180 MG tablet Take 180 mg by mouth daily as needed for allergies or rhinitis.   Yes [provider]  ibuprofen (ADVIL) 200 MG tablet Take 400 mg by mouth every 6 (six) hours as needed for moderate pain.   Yes [provider]  pravastatin  (PRAVACHOL ) 20 MG tablet Take 1 tablet (20 mg total) by mouth daily. 04/02/23  Yes Levora Reyes SAUNDERS, MD  sildenafil (VIAGRA) 100 MG tablet Take 50-100 mg by mouth daily as needed for erectile dysfunction. 12/24/21  Yes [provider]  triamcinolone  (NASACORT  ALLERGY 24HR) 55 MCG/ACT AERO nasal inhaler Place 2 sprays into the nose daily as needed (allergies).   Yes [provider]   Social History   Socioeconomic History   Marital status: Married    Spouse name: Not on file   Number of children: 1   Years of education: Not on file   Highest education level: 12th grade  Occupational History   Not on file  Tobacco Use   Smoking status: Never   Smokeless tobacco: Never  Vaping Use   Vaping status: Never  Used  Substance and Sexual Activity   Alcohol use: Yes    Comment: rare occasion    Drug use: No   Sexual activity: Not Currently  Other Topics Concern   Not on file  Social History Narrative   Not on file   Social Drivers of Health   Financial Resource Strain: Low Risk  (09/10/2023)   Overall Financial Resource Strain (CARDIA)    Difficulty of Paying Living Expenses: Not hard at all  Food Insecurity: No Food Insecurity (09/10/2023)   Hunger Vital Sign    Worried About Running Out of Food in the Last Year:  Never true    Ran Out of Food in the Last Year: Never true  Transportation Needs: No Transportation Needs (09/10/2023)   PRAPARE - Administrator, Civil Service (Medical): No    Lack of Transportation (Non-Medical): No  Physical Activity: Sufficiently Active (09/10/2023)   Exercise Vital Sign    Days of Exercise per Week: 4 days    Minutes of Exercise per Session: 130 min  Stress: No Stress Concern Present (09/10/2023)   [employer] of Occupational Health - Occupational Stress Questionnaire    Feeling of Stress: Not at all  Social Connections: Socially Integrated (09/10/2023)   Social Connection and Isolation Panel    Frequency of Communication with Friends and Family: More than three times a week    Frequency of Social Gatherings with Friends and Family: More than three times a week    Attends Religious Services: More than 4 times per year    Active Member of [employer] or Organizations: Yes    Attends Banker Meetings: More than 4 times per year    Marital Status: Married  Catering manager Violence: Not At Risk (07/25/2022)   Humiliation, Afraid, Rape, and Kick questionnaire    Fear of Current or Ex-Partner: No    Emotionally Abused: No    Physically Abused: No    Sexually Abused: No    Review of Systems Per HPI.   Objective:   Vitals:   09/11/23 1559  BP: (!) 130/50  Pulse: 73  Temp: 98.8 F (37.1 C)  TempSrc: Temporal  SpO2: 96%  Weight: 153 lb (69.4 kg)     Physical Exam Vitals reviewed.  Constitutional:      Appearance: He is well-developed.  HENT:     Head: Normocephalic and atraumatic.     Right Ear: Tympanic membrane, ear canal and external ear normal.     Left Ear: Tympanic membrane, ear canal and external ear normal.     Nose: No rhinorrhea.     Mouth/Throat:     Pharynx: No oropharyngeal exudate or posterior oropharyngeal erythema.  Eyes:     Conjunctiva/sclera: Conjunctivae normal.     Pupils: Pupils are equal, round, and  reactive to light.  Cardiovascular:     Rate and Rhythm: Normal rate and regular rhythm.     Heart sounds: Normal heart sounds. No murmur heard. Pulmonary:     Effort: Pulmonary effort is normal.     Breath sounds: Normal breath sounds. No wheezing, rhonchi or rales.  Abdominal:     Palpations: Abdomen is soft.     Tenderness: There is no abdominal tenderness.  Musculoskeletal:     Cervical back: Neck supple.  Lymphadenopathy:     Cervical: No cervical adenopathy.  Skin:    General: Skin is warm and dry.     Findings: No rash.  Neurological:  Mental Status: He is alert and oriented to person, place, and time.  Psychiatric:        Behavior: Behavior normal.        Assessment & Plan:  TREQUAN MARSOLEK is a 68 y.o. male . Sinobronchitis - Plan: amoxicillin -clavulanate (AUGMENTIN ) 875-125 MG tablet  Mild intermittent asthma without complication - Plan: predniSONE  (DELTASONE ) 20 MG tablet Prolapse intermittent symptoms of chills, chest congestion, sinus pressure with discolored nasal discharge and phlegm.  Overall reassuring exam in office at present with possible slight improvement today.  Will hold on imaging based on current exam and slight improvement, less likely pneumonia, but prescribed Augmentin  for possible sinusitis/bronchitis versus improving community-acquired pneumonia, and he plans to start tomorrow if not significantly improved overnight with RTC precautions given.  Has used albuterol  more frequently recently, advised to start prednisone  if he continues to need albuterol  but has not yet needed it today.  As above, RTC precautions, ER precautions were given.  Potential side effects of antibiotics discussed.  Meds ordered this encounter  Medications   amoxicillin -clavulanate (AUGMENTIN ) 875-125 MG tablet    Sig: Take 1 tablet by mouth 2 (two) times daily.    Dispense:  20 tablet    Refill:  0   predniSONE  (DELTASONE ) 20 MG tablet    Sig: Take 2 tablets (40 mg total)  by mouth daily with breakfast.    Dispense:  6 tablet    Refill:  0   Patient Instructions  Exam was reassuring today.  If you are not continuing to improve tomorrow start the antibiotic Augmentin .  If any persistent fevers chills, or worsening symptoms please be seen.  If you do require albuterol  frequently again or worsening wheezing I also prescribed prednisone .   Return to the clinic or go to the nearest emergency room if any of your symptoms worsen or new symptoms occur.     Signed,   Reyes Pines, MD [facility], [facility] 09/11/23 9:53 PM

## 2023-10-15 ENCOUNTER — Ambulatory Visit (INDEPENDENT_AMBULATORY_CARE_PROVIDER_SITE_OTHER): Admitting: Family Medicine

## 2023-10-15 ENCOUNTER — Encounter: Payer: Self-pay | Admitting: Family Medicine

## 2023-10-15 VITALS — BP 118/62 | HR 76 | Temp 98.0°F | Resp 18 | Ht 70.75 in | Wt 150.6 lb

## 2023-10-15 DIAGNOSIS — J4520 Mild intermittent asthma, uncomplicated: Secondary | ICD-10-CM

## 2023-10-15 DIAGNOSIS — Z23 Encounter for immunization: Secondary | ICD-10-CM | POA: Diagnosis not present

## 2023-10-15 DIAGNOSIS — E785 Hyperlipidemia, unspecified: Secondary | ICD-10-CM

## 2023-10-15 LAB — LIPID PANEL
Cholesterol: 128 mg/dL (ref 0–200)
HDL: 41.3 mg/dL (ref >39.00)
LDL Cholesterol: 71 mg/dL (ref 0–99)
NonHDL: 86.79
Total CHOL/HDL Ratio: 3
Triglycerides: 77 mg/dL (ref 0.0–149.0)
VLDL: 15.4 mg/dL (ref 0.0–40.0)

## 2023-10-15 LAB — COMPREHENSIVE METABOLIC PANEL WITH GFR
ALT: 26 U/L (ref 0–53)
AST: 40 U/L — ABNORMAL HIGH (ref 0–37)
Albumin: 4.4 g/dL (ref 3.5–5.2)
Alkaline Phosphatase: 55 U/L (ref 39–117)
BUN: 21 mg/dL (ref 6–23)
CO2: 27 meq/L (ref 19–32)
Calcium: 9.8 mg/dL (ref 8.4–10.5)
Chloride: 103 meq/L (ref 96–112)
Creatinine, Ser: 0.84 mg/dL (ref 0.40–1.50)
GFR: 89 mL/min (ref >60.00)
Glucose, Bld: 67 mg/dL — ABNORMAL LOW (ref 70–99)
Potassium: 4.6 meq/L (ref 3.5–5.1)
Sodium: 138 meq/L (ref 135–145)
Total Bilirubin: 0.9 mg/dL (ref 0.2–1.2)
Total Protein: 7.1 g/dL (ref 6.0–8.3)

## 2023-10-15 MED ORDER — PRAVASTATIN SODIUM 20 MG PO TABS: 20.0000 mg | ORAL_TABLET | Freq: Every day | ORAL | 3 refills | Status: AC

## 2023-10-15 NOTE — Patient Instructions (Addendum)
 Thank you for coming in today. No change in medications at this time. If there are any concerns on your bloodwork, I will let you know.  We do have the option of higher dose pravastatin  depending on lab results.  As we discussed you can try coming off medication temporarily to see if aches and pains in the back or knees are related to the statin medicine or more likely degenerative changes or arthritis.  If you do stop medication, symptoms improve, and want to change meds, let me know.  If any new side effects or concerns let me know.  Otherwise I will follow-up with you in 6 months.  Take care!

## 2023-10-15 NOTE — Progress Notes (Signed)
 Subjective:  Patient ID: Shaun Lee, male    DOB: 1955/08/29  Age: 68 y.o. MRN: [id number]  CC:  Chief Complaint  Patient presents with   Follow-up    6 month med check and labs     HPI Shaun Lee presents for    Hyperlipidemia: Atorvastatin  use previously along with co-Q10, did have some arthralgias, with daily dosing.  Improved with intermittent dosing, then lower dosage.  Changed to pravastatin  20 mg initially as alternative with RTC precautions.  Repeat labs with overall reassuring lipids in April. Prior coronary calcium  scoring of 140 with 53rd percentile, left main and left anterior descending arteries with aortic atherosclerosis in April 2024.  Still taking pravastatin  daily - seems to be better tolerated, along with CoQ10. Some soreness with activities at times, active with squatting, sore in back and knees with physical work. Would like to stay on same dose of med for now.  Lab Results  Component Value Date   CHOL 134 05/14/2023   HDL 45.50 05/14/2023   LDLCALC 74 05/14/2023   TRIG 73.0 05/14/2023   CHOLHDL 3 05/14/2023   Lab Results  Component Value Date   ALT 24 05/14/2023   AST 25 05/14/2023   ALKPHOS 57 05/14/2023   BILITOT 1.0 05/14/2023    Mild intermittent asthma With allergic rhinitis.  Albuterol  as needed, symptoms had improved when discussed in March since retirement.  Only rare need for albuterol .  Nasacort  and Allegra as needed as well for allergies but was not needing at his March visit.  He was treated for sinobronchitis on August 14 with Augmentin  if not improving.  Possible viral illness.  Exam was reassuring at that time.took abx, no residual symptoms. No recent need for albuterol , nasocort or allegra. Usually flares with oak pollen.   On viagra from urology - flushing in face. No CP/DOE, vision/hearing changes.   Health Maintenance: Plans on flu vaccine today - covid booster planned at pharmacy.  Declines AWV.    History Patient  Active Problem List   Diagnosis Date Noted   BPH with obstruction/lower urinary tract symptoms 01/24/2022   History of skin cancer 11/06/2021   Lentigo 11/06/2021   Asthma 05/13/2018   Allergic rhinitis 05/13/2018   Inguinal hernia, right 07/28/2013   Spermatocele of epididymis, single 07/28/2013   Past Medical History:  Diagnosis Date   Allergy    Asthma    Cancer (HCC)    skin cancer   Past Surgical History:  Procedure Laterality Date   HERNIA REPAIR     TRANSURETHRAL RESECTION OF PROSTATE N/A 01/24/2022   Procedure: TRANSURETHRAL RESECTION OF THE PROSTATE (TURP);  Surgeon: Watt Rush, MD;  Location: [facility];  Service: Urology;  Laterality: N/A;   No Known Allergies Prior to Admission medications   Medication Sig Start Date End Date Taking? Authorizing Provider  albuterol  (VENTOLIN  HFA) 108 (90 Base) MCG/ACT inhaler INHALE 2 PUFFS INTO THE LUNGS EVERY 4 (FOUR) HOURS AS NEEDED FOR WHEEZING OR SHORTNESS OF BREATH (COUGH, SHORTNESS OF BREATH OR WHEEZING.). 03/30/19  Yes Levora Reyes SAUNDERS, MD  fexofenadine (ALLEGRA) 180 MG tablet Take 180 mg by mouth daily as needed for allergies or rhinitis.   Yes [provider]  ibuprofen (ADVIL) 200 MG tablet Take 400 mg by mouth every 6 (six) hours as needed for moderate pain.   Yes [provider]  pravastatin  (PRAVACHOL ) 20 MG tablet Take 1 tablet (20 mg total) by mouth daily. 04/02/23  Yes Levora Reyes SAUNDERS,  MD  sildenafil (VIAGRA) 100 MG tablet Take 50-100 mg by mouth daily as needed for erectile dysfunction. 12/24/21  Yes [provider]  triamcinolone  (NASACORT  ALLERGY 24HR) 55 MCG/ACT AERO nasal inhaler Place 2 sprays into the nose daily as needed (allergies).   Yes [provider]  amoxicillin -clavulanate (AUGMENTIN ) 875-125 MG tablet Take 1 tablet by mouth 2 (two) times daily. Patient not taking: Reported on 10/15/2023 09/11/23   Levora Reyes SAUNDERS, MD  predniSONE  (DELTASONE ) 20 MG tablet Take 2 tablets (40  mg total) by mouth daily with breakfast. Patient not taking: Reported on 10/15/2023 09/11/23   Levora Reyes SAUNDERS, MD   Social History   Socioeconomic History   Marital status: Married    Spouse name: Not on file   Number of children: 1   Years of education: Not on file   Highest education level: 12th grade  Occupational History   Not on file  Tobacco Use   Smoking status: Never   Smokeless tobacco: Never  Vaping Use   Vaping status: Never Used  Substance and Sexual Activity   Alcohol use: Yes    Comment: rare occasion    Drug use: No   Sexual activity: Not Currently  Other Topics Concern   Not on file  Social History Narrative   Not on file   Social Drivers of Health   Financial Resource Strain: Low Risk  (09/10/2023)   Overall Financial Resource Strain (CARDIA)    Difficulty of Paying Living Expenses: Not hard at all  Food Insecurity: No Food Insecurity (09/10/2023)   Hunger Vital Sign    Worried About Running Out of Food in the Last Year: Never true    Ran Out of Food in the Last Year: Never true  Transportation Needs: No Transportation Needs (09/10/2023)   PRAPARE - Administrator, Civil Service (Medical): No    Lack of Transportation (Non-Medical): No  Physical Activity: Sufficiently Active (09/10/2023)   Exercise Vital Sign    Days of Exercise per Week: 4 days    Minutes of Exercise per Session: 130 min  Stress: No Stress Concern Present (09/10/2023)   [employer] of Occupational Health - Occupational Stress Questionnaire    Feeling of Stress: Not at all  Social Connections: Socially Integrated (09/10/2023)   Social Connection and Isolation Panel    Frequency of Communication with Friends and Family: More than three times a week    Frequency of Social Gatherings with Friends and Family: More than three times a week    Attends Religious Services: More than 4 times per year    Active Member of [employer] or Organizations: Yes    Attends Tax inspector Meetings: More than 4 times per year    Marital Status: Married  Catering manager Violence: Not At Risk (07/25/2022)   Humiliation, Afraid, Rape, and Kick questionnaire    Fear of Current or Ex-Partner: No    Emotionally Abused: No    Physically Abused: No    Sexually Abused: No    Review of Systems Per HPI.   Objective:   Vitals:   10/15/23 1013  BP: 118/62  Pulse: 76  Resp: 18  Temp: 98 F (36.7 C)  TempSrc: Temporal  SpO2: 97%  Weight: 150 lb 9.6 oz (68.3 kg)  Height: 5' 10.75 (1.797 m)     Physical Exam Vitals reviewed.  Constitutional:      Appearance: He is well-developed.  HENT:     Head: Normocephalic and  atraumatic.  Neck:     Vascular: No carotid bruit or JVD.  Cardiovascular:     Rate and Rhythm: Normal rate and regular rhythm.     Heart sounds: Normal heart sounds. No murmur heard. Pulmonary:     Effort: Pulmonary effort is normal.     Breath sounds: Normal breath sounds. No rales.  Musculoskeletal:     Right lower leg: No edema.     Left lower leg: No edema.  Skin:    General: Skin is warm and dry.  Neurological:     Mental Status: He is alert and oriented to person, place, and time.  Psychiatric:        Mood and Affect: Mood normal.        Assessment & Plan:  Shaun Lee is a 68 y.o. male . Hyperlipidemia, unspecified hyperlipidemia type - Plan: Comprehensive metabolic panel with GFR, Lipid panel - coronary artery calcification, aortic atherosclerosis as above.  Overall appears to be tolerating pravastatin  better than atorvastatin .  Borderline LDL in April, repeat labs today.  Intermittent arthralgias in knees and back likely related to degenerative changes but option of temporary cessation of statin and if symptoms resolve can look at alternate options.  Mild intermittent asthma without complication  - With intermittent symptoms, allergic rhinitis, typically springtime with oak pollen.  Has albuterol  as needed, other  allergy meds as above, asymptomatic currently.  Needs flu shot - Plan: Flu vaccine HIGH DOSE PF(Fluzone Trivalent) Flu vaccine given, plans on COVID booster at pharmacy.  Meds ordered this encounter  Medications   pravastatin  (PRAVACHOL ) 20 MG tablet    Sig: Take 1 tablet (20 mg total) by mouth daily.    Dispense:  90 tablet    Refill:  3   Patient Instructions  Thank you for coming in today. No change in medications at this time. If there are any concerns on your bloodwork, I will let you know.  We do have the option of higher dose pravastatin  depending on lab results.  As we discussed you can try coming off medication temporarily to see if aches and pains in the back or knees are related to the statin medicine or more likely degenerative changes or arthritis.  If you do stop medication, symptoms improve, and want to change meds, let me know.  If any new side effects or concerns let me know.  Otherwise I will follow-up with you in 6 months.  Take care!     Signed,   Reyes Pines, MD [facility], [facility] 10/15/23 11:06 AM

## 2023-10-17 ENCOUNTER — Ambulatory Visit: Payer: Self-pay | Admitting: Family Medicine

## 2024-01-01 DIAGNOSIS — X32XXXD Exposure to sunlight, subsequent encounter: Secondary | ICD-10-CM | POA: Diagnosis not present

## 2024-01-01 DIAGNOSIS — L570 Actinic keratosis: Secondary | ICD-10-CM | POA: Diagnosis not present

## 2024-01-01 DIAGNOSIS — L02223 Furuncle of chest wall: Secondary | ICD-10-CM | POA: Diagnosis not present

## 2024-01-01 DIAGNOSIS — B9689 Other specified bacterial agents as the cause of diseases classified elsewhere: Secondary | ICD-10-CM | POA: Diagnosis not present

## 2024-04-16 ENCOUNTER — Encounter: Admitting: Family Medicine
